# Patient Record
Sex: Female | Born: 1950
Health system: Southern US, Community
[De-identification: ages and names within clinical notes are randomized; demographics above are authoritative.]

## PROBLEM LIST (undated history)

## (undated) DIAGNOSIS — K219 Gastro-esophageal reflux disease without esophagitis: Secondary | ICD-10-CM

## (undated) DIAGNOSIS — T7840XA Allergy, unspecified, initial encounter: Secondary | ICD-10-CM

## (undated) DIAGNOSIS — M1990 Unspecified osteoarthritis, unspecified site: Secondary | ICD-10-CM

## (undated) DIAGNOSIS — E079 Disorder of thyroid, unspecified: Secondary | ICD-10-CM

## (undated) DIAGNOSIS — R011 Cardiac murmur, unspecified: Secondary | ICD-10-CM

## (undated) DIAGNOSIS — G43909 Migraine, unspecified, not intractable, without status migrainosus: Secondary | ICD-10-CM

## (undated) DIAGNOSIS — R569 Unspecified convulsions: Secondary | ICD-10-CM

## (undated) DIAGNOSIS — K5792 Diverticulitis of intestine, part unspecified, without perforation or abscess without bleeding: Secondary | ICD-10-CM

## (undated) DIAGNOSIS — R32 Unspecified urinary incontinence: Secondary | ICD-10-CM

## (undated) DIAGNOSIS — Z8619 Personal history of other infectious and parasitic diseases: Secondary | ICD-10-CM

## (undated) HISTORY — DX: Allergy, unspecified, initial encounter: T78.40XA

## (undated) HISTORY — DX: Cardiac murmur, unspecified: R01.1

## (undated) HISTORY — DX: Migraine, unspecified, not intractable, without status migrainosus: G43.909

## (undated) HISTORY — DX: Unspecified urinary incontinence: R32

## (undated) HISTORY — PX: REPLACEMENT TOTAL KNEE BILATERAL: SUR1225

## (undated) HISTORY — DX: Unspecified osteoarthritis, unspecified site: M19.90

## (undated) HISTORY — DX: Disorder of thyroid, unspecified: E07.9

## (undated) HISTORY — DX: Unspecified convulsions: R56.9

## (undated) HISTORY — DX: Gastro-esophageal reflux disease without esophagitis: K21.9

## (undated) HISTORY — DX: Diverticulitis of intestine, part unspecified, without perforation or abscess without bleeding: K57.92

## (undated) HISTORY — DX: Personal history of other infectious and parasitic diseases: Z86.19

---
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
## 1975-04-13 HISTORY — PX: BONE TUMOR EXCISION: SHX1254

## 1981-04-12 HISTORY — PX: ABDOMINAL HYSTERECTOMY: SHX81

## 2006-04-12 HISTORY — PX: CHOLECYSTECTOMY: SHX55

## 2016-04-14 DIAGNOSIS — E039 Hypothyroidism, unspecified: Secondary | ICD-10-CM | POA: Diagnosis not present

## 2016-04-14 DIAGNOSIS — M545 Low back pain: Secondary | ICD-10-CM | POA: Diagnosis not present

## 2016-04-14 DIAGNOSIS — E559 Vitamin D deficiency, unspecified: Secondary | ICD-10-CM | POA: Diagnosis not present

## 2016-04-14 DIAGNOSIS — I10 Essential (primary) hypertension: Secondary | ICD-10-CM | POA: Diagnosis not present

## 2016-04-22 DIAGNOSIS — R221 Localized swelling, mass and lump, neck: Secondary | ICD-10-CM | POA: Diagnosis not present

## 2016-04-29 DIAGNOSIS — M961 Postlaminectomy syndrome, not elsewhere classified: Secondary | ICD-10-CM | POA: Diagnosis not present

## 2016-04-29 DIAGNOSIS — M1712 Unilateral primary osteoarthritis, left knee: Secondary | ICD-10-CM | POA: Diagnosis not present

## 2016-04-29 DIAGNOSIS — M792 Neuralgia and neuritis, unspecified: Secondary | ICD-10-CM | POA: Diagnosis not present

## 2016-05-12 DIAGNOSIS — N950 Postmenopausal bleeding: Secondary | ICD-10-CM | POA: Diagnosis not present

## 2016-05-12 DIAGNOSIS — N952 Postmenopausal atrophic vaginitis: Secondary | ICD-10-CM | POA: Diagnosis not present

## 2016-05-26 DIAGNOSIS — M75102 Unspecified rotator cuff tear or rupture of left shoulder, not specified as traumatic: Secondary | ICD-10-CM | POA: Diagnosis not present

## 2016-05-27 DIAGNOSIS — M150 Primary generalized (osteo)arthritis: Secondary | ICD-10-CM | POA: Diagnosis not present

## 2016-05-27 DIAGNOSIS — M797 Fibromyalgia: Secondary | ICD-10-CM | POA: Diagnosis not present

## 2016-05-27 DIAGNOSIS — Z79899 Other long term (current) drug therapy: Secondary | ICD-10-CM | POA: Diagnosis not present

## 2016-05-27 DIAGNOSIS — E559 Vitamin D deficiency, unspecified: Secondary | ICD-10-CM | POA: Diagnosis not present

## 2016-05-27 DIAGNOSIS — R7982 Elevated C-reactive protein (CRP): Secondary | ICD-10-CM | POA: Diagnosis not present

## 2016-05-31 DIAGNOSIS — M75122 Complete rotator cuff tear or rupture of left shoulder, not specified as traumatic: Secondary | ICD-10-CM | POA: Diagnosis not present

## 2016-05-31 DIAGNOSIS — M25512 Pain in left shoulder: Secondary | ICD-10-CM | POA: Diagnosis not present

## 2016-06-09 DIAGNOSIS — R05 Cough: Secondary | ICD-10-CM | POA: Diagnosis not present

## 2016-07-05 DIAGNOSIS — M75102 Unspecified rotator cuff tear or rupture of left shoulder, not specified as traumatic: Secondary | ICD-10-CM | POA: Diagnosis not present

## 2016-07-13 DIAGNOSIS — I2510 Atherosclerotic heart disease of native coronary artery without angina pectoris: Secondary | ICD-10-CM | POA: Diagnosis not present

## 2016-07-13 DIAGNOSIS — E669 Obesity, unspecified: Secondary | ICD-10-CM | POA: Diagnosis not present

## 2016-07-13 DIAGNOSIS — I10 Essential (primary) hypertension: Secondary | ICD-10-CM | POA: Diagnosis not present

## 2016-07-13 DIAGNOSIS — Z01810 Encounter for preprocedural cardiovascular examination: Secondary | ICD-10-CM | POA: Diagnosis not present

## 2016-07-14 DIAGNOSIS — R1084 Generalized abdominal pain: Secondary | ICD-10-CM | POA: Diagnosis not present

## 2016-07-19 DIAGNOSIS — Z01818 Encounter for other preprocedural examination: Secondary | ICD-10-CM | POA: Diagnosis not present

## 2016-07-19 DIAGNOSIS — M75122 Complete rotator cuff tear or rupture of left shoulder, not specified as traumatic: Secondary | ICD-10-CM | POA: Diagnosis not present

## 2016-07-19 DIAGNOSIS — I10 Essential (primary) hypertension: Secondary | ICD-10-CM | POA: Diagnosis not present

## 2016-07-19 DIAGNOSIS — E039 Hypothyroidism, unspecified: Secondary | ICD-10-CM | POA: Diagnosis not present

## 2016-07-20 DIAGNOSIS — Z885 Allergy status to narcotic agent status: Secondary | ICD-10-CM | POA: Diagnosis not present

## 2016-07-20 DIAGNOSIS — R1032 Left lower quadrant pain: Secondary | ICD-10-CM | POA: Diagnosis not present

## 2016-07-20 DIAGNOSIS — R1030 Lower abdominal pain, unspecified: Secondary | ICD-10-CM | POA: Diagnosis not present

## 2016-07-20 DIAGNOSIS — Z881 Allergy status to other antibiotic agents status: Secondary | ICD-10-CM | POA: Diagnosis not present

## 2016-07-20 DIAGNOSIS — Z90710 Acquired absence of both cervix and uterus: Secondary | ICD-10-CM | POA: Diagnosis not present

## 2016-07-20 DIAGNOSIS — R300 Dysuria: Secondary | ICD-10-CM | POA: Diagnosis not present

## 2016-07-20 DIAGNOSIS — K219 Gastro-esophageal reflux disease without esophagitis: Secondary | ICD-10-CM | POA: Diagnosis not present

## 2016-07-20 DIAGNOSIS — E039 Hypothyroidism, unspecified: Secondary | ICD-10-CM | POA: Diagnosis not present

## 2016-07-20 DIAGNOSIS — Z91013 Allergy to seafood: Secondary | ICD-10-CM | POA: Diagnosis not present

## 2016-07-20 DIAGNOSIS — Z96651 Presence of right artificial knee joint: Secondary | ICD-10-CM | POA: Diagnosis not present

## 2016-07-20 DIAGNOSIS — Z79891 Long term (current) use of opiate analgesic: Secondary | ICD-10-CM | POA: Diagnosis not present

## 2016-07-20 DIAGNOSIS — Z89011 Acquired absence of right thumb: Secondary | ICD-10-CM | POA: Diagnosis not present

## 2016-07-20 DIAGNOSIS — Z886 Allergy status to analgesic agent status: Secondary | ICD-10-CM | POA: Diagnosis not present

## 2016-07-20 DIAGNOSIS — G8929 Other chronic pain: Secondary | ICD-10-CM | POA: Diagnosis not present

## 2016-07-20 DIAGNOSIS — I10 Essential (primary) hypertension: Secondary | ICD-10-CM | POA: Diagnosis not present

## 2016-07-20 DIAGNOSIS — Z91048 Other nonmedicinal substance allergy status: Secondary | ICD-10-CM | POA: Diagnosis not present

## 2016-07-26 DIAGNOSIS — R102 Pelvic and perineal pain: Secondary | ICD-10-CM | POA: Diagnosis not present

## 2016-07-30 DIAGNOSIS — M75102 Unspecified rotator cuff tear or rupture of left shoulder, not specified as traumatic: Secondary | ICD-10-CM | POA: Diagnosis not present

## 2016-07-30 DIAGNOSIS — S43432A Superior glenoid labrum lesion of left shoulder, initial encounter: Secondary | ICD-10-CM | POA: Diagnosis not present

## 2016-07-30 DIAGNOSIS — M7522 Bicipital tendinitis, left shoulder: Secondary | ICD-10-CM | POA: Diagnosis not present

## 2016-07-30 DIAGNOSIS — M75122 Complete rotator cuff tear or rupture of left shoulder, not specified as traumatic: Secondary | ICD-10-CM | POA: Diagnosis not present

## 2016-07-30 DIAGNOSIS — M24112 Other articular cartilage disorders, left shoulder: Secondary | ICD-10-CM | POA: Diagnosis not present

## 2016-08-23 DIAGNOSIS — I70213 Atherosclerosis of native arteries of extremities with intermittent claudication, bilateral legs: Secondary | ICD-10-CM | POA: Diagnosis not present

## 2016-08-23 DIAGNOSIS — I6523 Occlusion and stenosis of bilateral carotid arteries: Secondary | ICD-10-CM | POA: Diagnosis not present

## 2016-09-01 DIAGNOSIS — I6523 Occlusion and stenosis of bilateral carotid arteries: Secondary | ICD-10-CM | POA: Diagnosis not present

## 2016-09-01 DIAGNOSIS — I872 Venous insufficiency (chronic) (peripheral): Secondary | ICD-10-CM | POA: Diagnosis not present

## 2016-09-21 DIAGNOSIS — Z79899 Other long term (current) drug therapy: Secondary | ICD-10-CM | POA: Diagnosis not present

## 2016-09-21 DIAGNOSIS — M797 Fibromyalgia: Secondary | ICD-10-CM | POA: Diagnosis not present

## 2016-09-21 DIAGNOSIS — E039 Hypothyroidism, unspecified: Secondary | ICD-10-CM | POA: Diagnosis not present

## 2016-09-21 DIAGNOSIS — K219 Gastro-esophageal reflux disease without esophagitis: Secondary | ICD-10-CM | POA: Diagnosis not present

## 2016-09-24 DIAGNOSIS — M961 Postlaminectomy syndrome, not elsewhere classified: Secondary | ICD-10-CM | POA: Diagnosis not present

## 2016-09-24 DIAGNOSIS — Z79891 Long term (current) use of opiate analgesic: Secondary | ICD-10-CM | POA: Diagnosis not present

## 2016-09-24 DIAGNOSIS — Z96652 Presence of left artificial knee joint: Secondary | ICD-10-CM | POA: Diagnosis not present

## 2016-09-24 DIAGNOSIS — M75102 Unspecified rotator cuff tear or rupture of left shoulder, not specified as traumatic: Secondary | ICD-10-CM | POA: Diagnosis not present

## 2016-09-24 DIAGNOSIS — M4802 Spinal stenosis, cervical region: Secondary | ICD-10-CM | POA: Diagnosis not present

## 2016-09-30 DIAGNOSIS — Z0000 Encounter for general adult medical examination without abnormal findings: Secondary | ICD-10-CM | POA: Diagnosis not present

## 2016-09-30 DIAGNOSIS — E039 Hypothyroidism, unspecified: Secondary | ICD-10-CM | POA: Diagnosis not present

## 2016-09-30 DIAGNOSIS — G4700 Insomnia, unspecified: Secondary | ICD-10-CM | POA: Diagnosis not present

## 2016-10-02 DIAGNOSIS — I10 Essential (primary) hypertension: Secondary | ICD-10-CM | POA: Diagnosis not present

## 2016-10-02 DIAGNOSIS — G8929 Other chronic pain: Secondary | ICD-10-CM | POA: Diagnosis not present

## 2016-10-02 DIAGNOSIS — Z79899 Other long term (current) drug therapy: Secondary | ICD-10-CM | POA: Diagnosis not present

## 2016-10-02 DIAGNOSIS — M62838 Other muscle spasm: Secondary | ICD-10-CM | POA: Diagnosis not present

## 2016-10-02 DIAGNOSIS — Z79891 Long term (current) use of opiate analgesic: Secondary | ICD-10-CM | POA: Diagnosis not present

## 2016-10-02 DIAGNOSIS — Z9889 Other specified postprocedural states: Secondary | ICD-10-CM | POA: Diagnosis not present

## 2016-10-02 DIAGNOSIS — M25512 Pain in left shoulder: Secondary | ICD-10-CM | POA: Diagnosis not present

## 2016-10-02 DIAGNOSIS — M47812 Spondylosis without myelopathy or radiculopathy, cervical region: Secondary | ICD-10-CM | POA: Diagnosis not present

## 2016-10-02 DIAGNOSIS — M797 Fibromyalgia: Secondary | ICD-10-CM | POA: Diagnosis not present

## 2016-10-02 DIAGNOSIS — Z981 Arthrodesis status: Secondary | ICD-10-CM | POA: Diagnosis not present

## 2016-10-02 DIAGNOSIS — M542 Cervicalgia: Secondary | ICD-10-CM | POA: Diagnosis not present

## 2016-10-04 DIAGNOSIS — M542 Cervicalgia: Secondary | ICD-10-CM | POA: Diagnosis not present

## 2016-10-04 DIAGNOSIS — M4802 Spinal stenosis, cervical region: Secondary | ICD-10-CM | POA: Diagnosis not present

## 2016-10-04 DIAGNOSIS — I10 Essential (primary) hypertension: Secondary | ICD-10-CM | POA: Diagnosis not present

## 2016-10-04 DIAGNOSIS — M5412 Radiculopathy, cervical region: Secondary | ICD-10-CM | POA: Diagnosis not present

## 2016-10-21 DIAGNOSIS — M19012 Primary osteoarthritis, left shoulder: Secondary | ICD-10-CM | POA: Diagnosis not present

## 2016-11-02 DIAGNOSIS — M961 Postlaminectomy syndrome, not elsewhere classified: Secondary | ICD-10-CM | POA: Diagnosis not present

## 2016-11-02 DIAGNOSIS — M75122 Complete rotator cuff tear or rupture of left shoulder, not specified as traumatic: Secondary | ICD-10-CM | POA: Diagnosis not present

## 2016-11-02 DIAGNOSIS — Z96652 Presence of left artificial knee joint: Secondary | ICD-10-CM | POA: Diagnosis not present

## 2016-11-02 DIAGNOSIS — Z79891 Long term (current) use of opiate analgesic: Secondary | ICD-10-CM | POA: Diagnosis not present

## 2016-11-02 DIAGNOSIS — M4802 Spinal stenosis, cervical region: Secondary | ICD-10-CM | POA: Diagnosis not present

## 2016-11-09 DIAGNOSIS — H9201 Otalgia, right ear: Secondary | ICD-10-CM | POA: Diagnosis not present

## 2016-11-18 DIAGNOSIS — Z91041 Radiographic dye allergy status: Secondary | ICD-10-CM | POA: Diagnosis not present

## 2016-11-18 DIAGNOSIS — Z79891 Long term (current) use of opiate analgesic: Secondary | ICD-10-CM | POA: Diagnosis not present

## 2016-11-18 DIAGNOSIS — Z9049 Acquired absence of other specified parts of digestive tract: Secondary | ICD-10-CM | POA: Diagnosis not present

## 2016-11-18 DIAGNOSIS — Z96653 Presence of artificial knee joint, bilateral: Secondary | ICD-10-CM | POA: Diagnosis not present

## 2016-11-18 DIAGNOSIS — Z8049 Family history of malignant neoplasm of other genital organs: Secondary | ICD-10-CM | POA: Diagnosis not present

## 2016-11-18 DIAGNOSIS — Z888 Allergy status to other drugs, medicaments and biological substances status: Secondary | ICD-10-CM | POA: Diagnosis not present

## 2016-11-18 DIAGNOSIS — M4802 Spinal stenosis, cervical region: Secondary | ICD-10-CM | POA: Diagnosis not present

## 2016-11-18 DIAGNOSIS — Z880 Allergy status to penicillin: Secondary | ICD-10-CM | POA: Diagnosis not present

## 2016-11-18 DIAGNOSIS — Z885 Allergy status to narcotic agent status: Secondary | ICD-10-CM | POA: Diagnosis not present

## 2016-11-18 DIAGNOSIS — K219 Gastro-esophageal reflux disease without esophagitis: Secondary | ICD-10-CM | POA: Diagnosis not present

## 2016-11-18 DIAGNOSIS — Z91013 Allergy to seafood: Secondary | ICD-10-CM | POA: Diagnosis not present

## 2016-11-18 DIAGNOSIS — E039 Hypothyroidism, unspecified: Secondary | ICD-10-CM | POA: Diagnosis not present

## 2016-11-18 DIAGNOSIS — I10 Essential (primary) hypertension: Secondary | ICD-10-CM | POA: Diagnosis not present

## 2016-11-18 DIAGNOSIS — Z881 Allergy status to other antibiotic agents status: Secondary | ICD-10-CM | POA: Diagnosis not present

## 2016-11-18 DIAGNOSIS — M5412 Radiculopathy, cervical region: Secondary | ICD-10-CM | POA: Diagnosis not present

## 2016-11-18 DIAGNOSIS — Z886 Allergy status to analgesic agent status: Secondary | ICD-10-CM | POA: Diagnosis not present

## 2016-11-21 DIAGNOSIS — L233 Allergic contact dermatitis due to drugs in contact with skin: Secondary | ICD-10-CM | POA: Diagnosis not present

## 2016-11-21 DIAGNOSIS — I10 Essential (primary) hypertension: Secondary | ICD-10-CM | POA: Diagnosis not present

## 2016-11-21 DIAGNOSIS — T498X5A Adverse effect of other topical agents, initial encounter: Secondary | ICD-10-CM | POA: Diagnosis not present

## 2016-12-17 DIAGNOSIS — R6884 Jaw pain: Secondary | ICD-10-CM | POA: Diagnosis not present

## 2016-12-17 DIAGNOSIS — R220 Localized swelling, mass and lump, head: Secondary | ICD-10-CM | POA: Diagnosis not present

## 2016-12-20 DIAGNOSIS — R221 Localized swelling, mass and lump, neck: Secondary | ICD-10-CM | POA: Diagnosis not present

## 2016-12-20 DIAGNOSIS — R6884 Jaw pain: Secondary | ICD-10-CM | POA: Diagnosis not present

## 2016-12-27 DIAGNOSIS — S161XXA Strain of muscle, fascia and tendon at neck level, initial encounter: Secondary | ICD-10-CM | POA: Diagnosis not present

## 2016-12-27 DIAGNOSIS — M4802 Spinal stenosis, cervical region: Secondary | ICD-10-CM | POA: Diagnosis not present

## 2016-12-29 DIAGNOSIS — N898 Other specified noninflammatory disorders of vagina: Secondary | ICD-10-CM | POA: Diagnosis not present

## 2016-12-31 DIAGNOSIS — M75102 Unspecified rotator cuff tear or rupture of left shoulder, not specified as traumatic: Secondary | ICD-10-CM | POA: Diagnosis not present

## 2016-12-31 DIAGNOSIS — M961 Postlaminectomy syndrome, not elsewhere classified: Secondary | ICD-10-CM | POA: Diagnosis not present

## 2016-12-31 DIAGNOSIS — Z79891 Long term (current) use of opiate analgesic: Secondary | ICD-10-CM | POA: Diagnosis not present

## 2016-12-31 DIAGNOSIS — Z96652 Presence of left artificial knee joint: Secondary | ICD-10-CM | POA: Diagnosis not present

## 2016-12-31 DIAGNOSIS — M4802 Spinal stenosis, cervical region: Secondary | ICD-10-CM | POA: Diagnosis not present

## 2017-01-07 DIAGNOSIS — M150 Primary generalized (osteo)arthritis: Secondary | ICD-10-CM | POA: Diagnosis not present

## 2017-01-07 DIAGNOSIS — R7982 Elevated C-reactive protein (CRP): Secondary | ICD-10-CM | POA: Diagnosis not present

## 2017-01-07 DIAGNOSIS — M797 Fibromyalgia: Secondary | ICD-10-CM | POA: Diagnosis not present

## 2017-01-07 DIAGNOSIS — Z79899 Other long term (current) drug therapy: Secondary | ICD-10-CM | POA: Diagnosis not present

## 2017-01-12 DIAGNOSIS — M5412 Radiculopathy, cervical region: Secondary | ICD-10-CM | POA: Diagnosis not present

## 2017-01-12 DIAGNOSIS — E039 Hypothyroidism, unspecified: Secondary | ICD-10-CM | POA: Diagnosis not present

## 2017-01-12 DIAGNOSIS — Z881 Allergy status to other antibiotic agents status: Secondary | ICD-10-CM | POA: Diagnosis not present

## 2017-01-12 DIAGNOSIS — M48062 Spinal stenosis, lumbar region with neurogenic claudication: Secondary | ICD-10-CM | POA: Diagnosis not present

## 2017-01-12 DIAGNOSIS — M961 Postlaminectomy syndrome, not elsewhere classified: Secondary | ICD-10-CM | POA: Diagnosis not present

## 2017-01-12 DIAGNOSIS — Z880 Allergy status to penicillin: Secondary | ICD-10-CM | POA: Diagnosis not present

## 2017-01-12 DIAGNOSIS — Z91013 Allergy to seafood: Secondary | ICD-10-CM | POA: Diagnosis not present

## 2017-01-12 DIAGNOSIS — M4802 Spinal stenosis, cervical region: Secondary | ICD-10-CM | POA: Diagnosis not present

## 2017-01-12 DIAGNOSIS — Z8049 Family history of malignant neoplasm of other genital organs: Secondary | ICD-10-CM | POA: Diagnosis not present

## 2017-01-12 DIAGNOSIS — Z886 Allergy status to analgesic agent status: Secondary | ICD-10-CM | POA: Diagnosis not present

## 2017-01-12 DIAGNOSIS — I10 Essential (primary) hypertension: Secondary | ICD-10-CM | POA: Diagnosis not present

## 2017-01-12 DIAGNOSIS — K219 Gastro-esophageal reflux disease without esophagitis: Secondary | ICD-10-CM | POA: Diagnosis not present

## 2017-01-12 DIAGNOSIS — Z885 Allergy status to narcotic agent status: Secondary | ICD-10-CM | POA: Diagnosis not present

## 2017-01-12 DIAGNOSIS — M5136 Other intervertebral disc degeneration, lumbar region: Secondary | ICD-10-CM | POA: Diagnosis not present

## 2017-01-29 DIAGNOSIS — R2242 Localized swelling, mass and lump, left lower limb: Secondary | ICD-10-CM | POA: Diagnosis not present

## 2017-01-29 DIAGNOSIS — L03116 Cellulitis of left lower limb: Secondary | ICD-10-CM | POA: Diagnosis not present

## 2017-01-29 DIAGNOSIS — R600 Localized edema: Secondary | ICD-10-CM | POA: Diagnosis not present

## 2017-01-29 DIAGNOSIS — I872 Venous insufficiency (chronic) (peripheral): Secondary | ICD-10-CM | POA: Diagnosis not present

## 2017-01-30 DIAGNOSIS — S20412A Abrasion of left back wall of thorax, initial encounter: Secondary | ICD-10-CM | POA: Diagnosis not present

## 2017-01-30 DIAGNOSIS — Z981 Arthrodesis status: Secondary | ICD-10-CM | POA: Diagnosis not present

## 2017-01-30 DIAGNOSIS — M25512 Pain in left shoulder: Secondary | ICD-10-CM | POA: Diagnosis not present

## 2017-01-30 DIAGNOSIS — M5489 Other dorsalgia: Secondary | ICD-10-CM | POA: Diagnosis not present

## 2017-01-30 DIAGNOSIS — T148XXA Other injury of unspecified body region, initial encounter: Secondary | ICD-10-CM | POA: Diagnosis not present

## 2017-01-30 DIAGNOSIS — M79622 Pain in left upper arm: Secondary | ICD-10-CM | POA: Diagnosis not present

## 2017-01-30 DIAGNOSIS — R0781 Pleurodynia: Secondary | ICD-10-CM | POA: Diagnosis not present

## 2017-01-30 DIAGNOSIS — S20212A Contusion of left front wall of thorax, initial encounter: Secondary | ICD-10-CM | POA: Diagnosis not present

## 2017-01-30 DIAGNOSIS — I10 Essential (primary) hypertension: Secondary | ICD-10-CM | POA: Diagnosis not present

## 2017-02-01 DIAGNOSIS — L03116 Cellulitis of left lower limb: Secondary | ICD-10-CM | POA: Diagnosis not present

## 2017-02-01 DIAGNOSIS — E559 Vitamin D deficiency, unspecified: Secondary | ICD-10-CM | POA: Diagnosis not present

## 2017-02-01 DIAGNOSIS — R799 Abnormal finding of blood chemistry, unspecified: Secondary | ICD-10-CM | POA: Diagnosis not present

## 2017-02-06 DIAGNOSIS — G2581 Restless legs syndrome: Secondary | ICD-10-CM | POA: Diagnosis not present

## 2017-02-06 DIAGNOSIS — I10 Essential (primary) hypertension: Secondary | ICD-10-CM | POA: Diagnosis not present

## 2017-02-06 DIAGNOSIS — Z981 Arthrodesis status: Secondary | ICD-10-CM | POA: Diagnosis not present

## 2017-02-06 DIAGNOSIS — Z6839 Body mass index (BMI) 39.0-39.9, adult: Secondary | ICD-10-CM | POA: Diagnosis not present

## 2017-02-06 DIAGNOSIS — M25512 Pain in left shoulder: Secondary | ICD-10-CM | POA: Diagnosis present

## 2017-02-06 DIAGNOSIS — M79661 Pain in right lower leg: Secondary | ICD-10-CM | POA: Diagnosis not present

## 2017-02-06 DIAGNOSIS — E039 Hypothyroidism, unspecified: Secondary | ICD-10-CM | POA: Diagnosis present

## 2017-02-06 DIAGNOSIS — G43909 Migraine, unspecified, not intractable, without status migrainosus: Secondary | ICD-10-CM | POA: Diagnosis present

## 2017-02-06 DIAGNOSIS — G4730 Sleep apnea, unspecified: Secondary | ICD-10-CM | POA: Diagnosis present

## 2017-02-06 DIAGNOSIS — Z0189 Encounter for other specified special examinations: Secondary | ICD-10-CM | POA: Diagnosis not present

## 2017-02-06 DIAGNOSIS — M797 Fibromyalgia: Secondary | ICD-10-CM | POA: Diagnosis not present

## 2017-02-06 DIAGNOSIS — I872 Venous insufficiency (chronic) (peripheral): Secondary | ICD-10-CM | POA: Diagnosis present

## 2017-02-06 DIAGNOSIS — Z96653 Presence of artificial knee joint, bilateral: Secondary | ICD-10-CM | POA: Diagnosis present

## 2017-02-06 DIAGNOSIS — Z90710 Acquired absence of both cervix and uterus: Secondary | ICD-10-CM | POA: Diagnosis not present

## 2017-02-06 DIAGNOSIS — L03116 Cellulitis of left lower limb: Secondary | ICD-10-CM | POA: Diagnosis not present

## 2017-02-06 DIAGNOSIS — M549 Dorsalgia, unspecified: Secondary | ICD-10-CM | POA: Diagnosis not present

## 2017-02-14 DIAGNOSIS — I6523 Occlusion and stenosis of bilateral carotid arteries: Secondary | ICD-10-CM | POA: Diagnosis not present

## 2017-02-14 DIAGNOSIS — I872 Venous insufficiency (chronic) (peripheral): Secondary | ICD-10-CM | POA: Diagnosis not present

## 2017-03-11 DIAGNOSIS — R05 Cough: Secondary | ICD-10-CM | POA: Diagnosis not present

## 2017-03-11 DIAGNOSIS — M961 Postlaminectomy syndrome, not elsewhere classified: Secondary | ICD-10-CM | POA: Diagnosis not present

## 2017-03-11 DIAGNOSIS — I872 Venous insufficiency (chronic) (peripheral): Secondary | ICD-10-CM | POA: Diagnosis not present

## 2017-03-11 DIAGNOSIS — R233 Spontaneous ecchymoses: Secondary | ICD-10-CM | POA: Diagnosis not present

## 2017-03-11 DIAGNOSIS — Z79891 Long term (current) use of opiate analgesic: Secondary | ICD-10-CM | POA: Diagnosis not present

## 2017-03-11 DIAGNOSIS — I119 Hypertensive heart disease without heart failure: Secondary | ICD-10-CM | POA: Diagnosis not present

## 2017-03-11 DIAGNOSIS — Z96652 Presence of left artificial knee joint: Secondary | ICD-10-CM | POA: Diagnosis not present

## 2017-03-11 DIAGNOSIS — M75102 Unspecified rotator cuff tear or rupture of left shoulder, not specified as traumatic: Secondary | ICD-10-CM | POA: Diagnosis not present

## 2017-03-11 DIAGNOSIS — E611 Iron deficiency: Secondary | ICD-10-CM | POA: Diagnosis not present

## 2017-03-11 DIAGNOSIS — M4802 Spinal stenosis, cervical region: Secondary | ICD-10-CM | POA: Diagnosis not present

## 2017-03-14 DIAGNOSIS — I890 Lymphedema, not elsewhere classified: Secondary | ICD-10-CM | POA: Diagnosis not present

## 2017-03-14 DIAGNOSIS — I872 Venous insufficiency (chronic) (peripheral): Secondary | ICD-10-CM | POA: Diagnosis not present

## 2017-03-18 DIAGNOSIS — M24574 Contracture, right foot: Secondary | ICD-10-CM | POA: Diagnosis not present

## 2017-03-18 DIAGNOSIS — M2041 Other hammer toe(s) (acquired), right foot: Secondary | ICD-10-CM | POA: Diagnosis not present

## 2017-03-18 DIAGNOSIS — M24575 Contracture, left foot: Secondary | ICD-10-CM | POA: Diagnosis not present

## 2017-03-18 DIAGNOSIS — M79675 Pain in left toe(s): Secondary | ICD-10-CM | POA: Diagnosis not present

## 2017-03-18 DIAGNOSIS — M2042 Other hammer toe(s) (acquired), left foot: Secondary | ICD-10-CM | POA: Diagnosis not present

## 2017-03-18 DIAGNOSIS — M79674 Pain in right toe(s): Secondary | ICD-10-CM | POA: Diagnosis not present

## 2017-04-20 DIAGNOSIS — M961 Postlaminectomy syndrome, not elsewhere classified: Secondary | ICD-10-CM | POA: Diagnosis not present

## 2017-04-20 DIAGNOSIS — M4802 Spinal stenosis, cervical region: Secondary | ICD-10-CM | POA: Diagnosis not present

## 2017-04-20 DIAGNOSIS — Z96652 Presence of left artificial knee joint: Secondary | ICD-10-CM | POA: Diagnosis not present

## 2017-04-20 DIAGNOSIS — M75102 Unspecified rotator cuff tear or rupture of left shoulder, not specified as traumatic: Secondary | ICD-10-CM | POA: Diagnosis not present

## 2017-04-20 DIAGNOSIS — Z79891 Long term (current) use of opiate analgesic: Secondary | ICD-10-CM | POA: Diagnosis not present

## 2017-04-21 DIAGNOSIS — I872 Venous insufficiency (chronic) (peripheral): Secondary | ICD-10-CM | POA: Diagnosis not present

## 2017-04-21 DIAGNOSIS — E669 Obesity, unspecified: Secondary | ICD-10-CM | POA: Diagnosis not present

## 2017-04-21 DIAGNOSIS — I890 Lymphedema, not elsewhere classified: Secondary | ICD-10-CM | POA: Diagnosis not present

## 2017-05-18 DIAGNOSIS — M533 Sacrococcygeal disorders, not elsewhere classified: Secondary | ICD-10-CM | POA: Diagnosis not present

## 2017-05-18 DIAGNOSIS — M4802 Spinal stenosis, cervical region: Secondary | ICD-10-CM | POA: Diagnosis not present

## 2017-05-18 DIAGNOSIS — M961 Postlaminectomy syndrome, not elsewhere classified: Secondary | ICD-10-CM | POA: Diagnosis not present

## 2017-05-18 DIAGNOSIS — Z96652 Presence of left artificial knee joint: Secondary | ICD-10-CM | POA: Diagnosis not present

## 2017-05-18 DIAGNOSIS — M75102 Unspecified rotator cuff tear or rupture of left shoulder, not specified as traumatic: Secondary | ICD-10-CM | POA: Diagnosis not present

## 2017-05-25 DIAGNOSIS — M533 Sacrococcygeal disorders, not elsewhere classified: Secondary | ICD-10-CM | POA: Diagnosis not present

## 2017-05-25 DIAGNOSIS — M461 Sacroiliitis, not elsewhere classified: Secondary | ICD-10-CM | POA: Diagnosis not present

## 2017-06-06 DIAGNOSIS — R7982 Elevated C-reactive protein (CRP): Secondary | ICD-10-CM | POA: Diagnosis not present

## 2017-06-06 DIAGNOSIS — M797 Fibromyalgia: Secondary | ICD-10-CM | POA: Diagnosis not present

## 2017-06-06 DIAGNOSIS — E039 Hypothyroidism, unspecified: Secondary | ICD-10-CM | POA: Diagnosis not present

## 2017-06-06 DIAGNOSIS — E559 Vitamin D deficiency, unspecified: Secondary | ICD-10-CM | POA: Diagnosis not present

## 2017-06-06 DIAGNOSIS — M150 Primary generalized (osteo)arthritis: Secondary | ICD-10-CM | POA: Diagnosis not present

## 2017-06-06 DIAGNOSIS — Z79899 Other long term (current) drug therapy: Secondary | ICD-10-CM | POA: Diagnosis not present

## 2017-06-22 DIAGNOSIS — M75102 Unspecified rotator cuff tear or rupture of left shoulder, not specified as traumatic: Secondary | ICD-10-CM | POA: Diagnosis not present

## 2017-06-22 DIAGNOSIS — M4802 Spinal stenosis, cervical region: Secondary | ICD-10-CM | POA: Diagnosis not present

## 2017-06-22 DIAGNOSIS — M533 Sacrococcygeal disorders, not elsewhere classified: Secondary | ICD-10-CM | POA: Diagnosis not present

## 2017-06-22 DIAGNOSIS — Z96652 Presence of left artificial knee joint: Secondary | ICD-10-CM | POA: Diagnosis not present

## 2017-06-22 DIAGNOSIS — M961 Postlaminectomy syndrome, not elsewhere classified: Secondary | ICD-10-CM | POA: Diagnosis not present

## 2017-07-19 DIAGNOSIS — I890 Lymphedema, not elsewhere classified: Secondary | ICD-10-CM | POA: Diagnosis not present

## 2017-07-20 DIAGNOSIS — M961 Postlaminectomy syndrome, not elsewhere classified: Secondary | ICD-10-CM | POA: Diagnosis not present

## 2017-07-20 DIAGNOSIS — M533 Sacrococcygeal disorders, not elsewhere classified: Secondary | ICD-10-CM | POA: Diagnosis not present

## 2017-07-20 DIAGNOSIS — M4802 Spinal stenosis, cervical region: Secondary | ICD-10-CM | POA: Diagnosis not present

## 2017-07-20 DIAGNOSIS — M75102 Unspecified rotator cuff tear or rupture of left shoulder, not specified as traumatic: Secondary | ICD-10-CM | POA: Diagnosis not present

## 2017-07-20 DIAGNOSIS — Z96652 Presence of left artificial knee joint: Secondary | ICD-10-CM | POA: Diagnosis not present

## 2017-07-21 DIAGNOSIS — F339 Major depressive disorder, recurrent, unspecified: Secondary | ICD-10-CM | POA: Diagnosis not present

## 2017-07-21 DIAGNOSIS — Z79899 Other long term (current) drug therapy: Secondary | ICD-10-CM | POA: Diagnosis not present

## 2017-07-21 DIAGNOSIS — I200 Unstable angina: Secondary | ICD-10-CM | POA: Diagnosis not present

## 2017-07-21 DIAGNOSIS — Z1379 Encounter for other screening for genetic and chromosomal anomalies: Secondary | ICD-10-CM | POA: Diagnosis not present

## 2017-07-21 DIAGNOSIS — I259 Chronic ischemic heart disease, unspecified: Secondary | ICD-10-CM | POA: Diagnosis not present

## 2017-08-18 DIAGNOSIS — M7918 Myalgia, other site: Secondary | ICD-10-CM | POA: Diagnosis not present

## 2017-08-18 DIAGNOSIS — M4802 Spinal stenosis, cervical region: Secondary | ICD-10-CM | POA: Diagnosis not present

## 2017-08-18 DIAGNOSIS — M75102 Unspecified rotator cuff tear or rupture of left shoulder, not specified as traumatic: Secondary | ICD-10-CM | POA: Diagnosis not present

## 2017-08-18 DIAGNOSIS — M8588 Other specified disorders of bone density and structure, other site: Secondary | ICD-10-CM | POA: Diagnosis not present

## 2017-08-18 DIAGNOSIS — E559 Vitamin D deficiency, unspecified: Secondary | ICD-10-CM | POA: Diagnosis not present

## 2017-08-18 DIAGNOSIS — E038 Other specified hypothyroidism: Secondary | ICD-10-CM | POA: Diagnosis not present

## 2017-08-18 DIAGNOSIS — M533 Sacrococcygeal disorders, not elsewhere classified: Secondary | ICD-10-CM | POA: Diagnosis not present

## 2017-08-18 DIAGNOSIS — Z96652 Presence of left artificial knee joint: Secondary | ICD-10-CM | POA: Diagnosis not present

## 2017-08-18 DIAGNOSIS — I10 Essential (primary) hypertension: Secondary | ICD-10-CM | POA: Diagnosis not present

## 2017-08-18 DIAGNOSIS — Z79891 Long term (current) use of opiate analgesic: Secondary | ICD-10-CM | POA: Diagnosis not present

## 2017-08-18 DIAGNOSIS — M961 Postlaminectomy syndrome, not elsewhere classified: Secondary | ICD-10-CM | POA: Diagnosis not present

## 2017-08-23 DIAGNOSIS — G4700 Insomnia, unspecified: Secondary | ICD-10-CM | POA: Diagnosis not present

## 2017-08-23 DIAGNOSIS — G43709 Chronic migraine without aura, not intractable, without status migrainosus: Secondary | ICD-10-CM | POA: Diagnosis not present

## 2017-08-23 DIAGNOSIS — J209 Acute bronchitis, unspecified: Secondary | ICD-10-CM | POA: Diagnosis not present

## 2017-08-23 DIAGNOSIS — I119 Hypertensive heart disease without heart failure: Secondary | ICD-10-CM | POA: Diagnosis not present

## 2017-08-25 DIAGNOSIS — I890 Lymphedema, not elsewhere classified: Secondary | ICD-10-CM | POA: Diagnosis not present

## 2017-08-31 DIAGNOSIS — R600 Localized edema: Secondary | ICD-10-CM | POA: Diagnosis not present

## 2017-08-31 DIAGNOSIS — E663 Overweight: Secondary | ICD-10-CM | POA: Diagnosis not present

## 2017-08-31 DIAGNOSIS — I6523 Occlusion and stenosis of bilateral carotid arteries: Secondary | ICD-10-CM | POA: Diagnosis not present

## 2017-08-31 DIAGNOSIS — I872 Venous insufficiency (chronic) (peripheral): Secondary | ICD-10-CM | POA: Diagnosis not present

## 2017-08-31 DIAGNOSIS — M79605 Pain in left leg: Secondary | ICD-10-CM | POA: Diagnosis not present

## 2017-09-15 DIAGNOSIS — M961 Postlaminectomy syndrome, not elsewhere classified: Secondary | ICD-10-CM | POA: Diagnosis not present

## 2017-09-15 DIAGNOSIS — M75102 Unspecified rotator cuff tear or rupture of left shoulder, not specified as traumatic: Secondary | ICD-10-CM | POA: Diagnosis not present

## 2017-09-15 DIAGNOSIS — M533 Sacrococcygeal disorders, not elsewhere classified: Secondary | ICD-10-CM | POA: Diagnosis not present

## 2017-09-15 DIAGNOSIS — Z96652 Presence of left artificial knee joint: Secondary | ICD-10-CM | POA: Diagnosis not present

## 2017-09-15 DIAGNOSIS — M4802 Spinal stenosis, cervical region: Secondary | ICD-10-CM | POA: Diagnosis not present

## 2017-10-17 DIAGNOSIS — M533 Sacrococcygeal disorders, not elsewhere classified: Secondary | ICD-10-CM | POA: Diagnosis not present

## 2017-10-17 DIAGNOSIS — Z96652 Presence of left artificial knee joint: Secondary | ICD-10-CM | POA: Diagnosis not present

## 2017-10-17 DIAGNOSIS — M75102 Unspecified rotator cuff tear or rupture of left shoulder, not specified as traumatic: Secondary | ICD-10-CM | POA: Diagnosis not present

## 2017-10-17 DIAGNOSIS — M4802 Spinal stenosis, cervical region: Secondary | ICD-10-CM | POA: Diagnosis not present

## 2017-10-17 DIAGNOSIS — M961 Postlaminectomy syndrome, not elsewhere classified: Secondary | ICD-10-CM | POA: Diagnosis not present

## 2017-10-24 DIAGNOSIS — I6523 Occlusion and stenosis of bilateral carotid arteries: Secondary | ICD-10-CM | POA: Diagnosis not present

## 2017-11-10 DIAGNOSIS — G43709 Chronic migraine without aura, not intractable, without status migrainosus: Secondary | ICD-10-CM | POA: Diagnosis not present

## 2017-11-10 DIAGNOSIS — R55 Syncope and collapse: Secondary | ICD-10-CM | POA: Diagnosis not present

## 2017-11-10 DIAGNOSIS — I119 Hypertensive heart disease without heart failure: Secondary | ICD-10-CM | POA: Diagnosis not present

## 2017-11-14 DIAGNOSIS — M4802 Spinal stenosis, cervical region: Secondary | ICD-10-CM | POA: Diagnosis not present

## 2017-11-14 DIAGNOSIS — Z79891 Long term (current) use of opiate analgesic: Secondary | ICD-10-CM | POA: Diagnosis not present

## 2017-11-14 DIAGNOSIS — M75102 Unspecified rotator cuff tear or rupture of left shoulder, not specified as traumatic: Secondary | ICD-10-CM | POA: Diagnosis not present

## 2017-11-14 DIAGNOSIS — Z96652 Presence of left artificial knee joint: Secondary | ICD-10-CM | POA: Diagnosis not present

## 2017-11-14 DIAGNOSIS — M7918 Myalgia, other site: Secondary | ICD-10-CM | POA: Diagnosis not present

## 2017-11-14 DIAGNOSIS — M961 Postlaminectomy syndrome, not elsewhere classified: Secondary | ICD-10-CM | POA: Diagnosis not present

## 2017-11-14 DIAGNOSIS — M533 Sacrococcygeal disorders, not elsewhere classified: Secondary | ICD-10-CM | POA: Diagnosis not present

## 2017-11-25 DIAGNOSIS — M797 Fibromyalgia: Secondary | ICD-10-CM | POA: Diagnosis not present

## 2017-11-25 DIAGNOSIS — Z79899 Other long term (current) drug therapy: Secondary | ICD-10-CM | POA: Diagnosis not present

## 2017-11-25 DIAGNOSIS — M150 Primary generalized (osteo)arthritis: Secondary | ICD-10-CM | POA: Diagnosis not present

## 2017-11-25 DIAGNOSIS — R7982 Elevated C-reactive protein (CRP): Secondary | ICD-10-CM | POA: Diagnosis not present

## 2017-12-21 DIAGNOSIS — M4802 Spinal stenosis, cervical region: Secondary | ICD-10-CM | POA: Diagnosis not present

## 2017-12-21 DIAGNOSIS — Z96652 Presence of left artificial knee joint: Secondary | ICD-10-CM | POA: Diagnosis not present

## 2017-12-21 DIAGNOSIS — M533 Sacrococcygeal disorders, not elsewhere classified: Secondary | ICD-10-CM | POA: Diagnosis not present

## 2017-12-21 DIAGNOSIS — M961 Postlaminectomy syndrome, not elsewhere classified: Secondary | ICD-10-CM | POA: Diagnosis not present

## 2017-12-21 DIAGNOSIS — M75102 Unspecified rotator cuff tear or rupture of left shoulder, not specified as traumatic: Secondary | ICD-10-CM | POA: Diagnosis not present

## 2017-12-28 DIAGNOSIS — G4700 Insomnia, unspecified: Secondary | ICD-10-CM | POA: Diagnosis not present

## 2017-12-28 DIAGNOSIS — R1011 Right upper quadrant pain: Secondary | ICD-10-CM | POA: Diagnosis not present

## 2017-12-28 DIAGNOSIS — R0602 Shortness of breath: Secondary | ICD-10-CM | POA: Diagnosis not present

## 2017-12-29 DIAGNOSIS — N281 Cyst of kidney, acquired: Secondary | ICD-10-CM | POA: Diagnosis not present

## 2018-01-18 DIAGNOSIS — Z96652 Presence of left artificial knee joint: Secondary | ICD-10-CM | POA: Diagnosis not present

## 2018-01-18 DIAGNOSIS — M533 Sacrococcygeal disorders, not elsewhere classified: Secondary | ICD-10-CM | POA: Diagnosis not present

## 2018-01-18 DIAGNOSIS — M961 Postlaminectomy syndrome, not elsewhere classified: Secondary | ICD-10-CM | POA: Diagnosis not present

## 2018-01-18 DIAGNOSIS — M75102 Unspecified rotator cuff tear or rupture of left shoulder, not specified as traumatic: Secondary | ICD-10-CM | POA: Diagnosis not present

## 2018-01-18 DIAGNOSIS — Z23 Encounter for immunization: Secondary | ICD-10-CM | POA: Diagnosis not present

## 2018-01-18 DIAGNOSIS — M4802 Spinal stenosis, cervical region: Secondary | ICD-10-CM | POA: Diagnosis not present

## 2018-02-02 DIAGNOSIS — R0602 Shortness of breath: Secondary | ICD-10-CM | POA: Diagnosis not present

## 2018-02-02 DIAGNOSIS — E611 Iron deficiency: Secondary | ICD-10-CM | POA: Diagnosis not present

## 2018-02-02 DIAGNOSIS — G4700 Insomnia, unspecified: Secondary | ICD-10-CM | POA: Diagnosis not present

## 2018-02-02 DIAGNOSIS — M797 Fibromyalgia: Secondary | ICD-10-CM | POA: Diagnosis not present

## 2018-02-02 DIAGNOSIS — E039 Hypothyroidism, unspecified: Secondary | ICD-10-CM | POA: Diagnosis not present

## 2018-02-08 DIAGNOSIS — M4802 Spinal stenosis, cervical region: Secondary | ICD-10-CM | POA: Diagnosis not present

## 2018-02-08 DIAGNOSIS — M461 Sacroiliitis, not elsewhere classified: Secondary | ICD-10-CM | POA: Diagnosis not present

## 2018-02-08 DIAGNOSIS — Z79891 Long term (current) use of opiate analgesic: Secondary | ICD-10-CM | POA: Diagnosis not present

## 2018-02-08 DIAGNOSIS — M7918 Myalgia, other site: Secondary | ICD-10-CM | POA: Diagnosis not present

## 2018-02-08 DIAGNOSIS — Z96652 Presence of left artificial knee joint: Secondary | ICD-10-CM | POA: Diagnosis not present

## 2018-02-08 DIAGNOSIS — M533 Sacrococcygeal disorders, not elsewhere classified: Secondary | ICD-10-CM | POA: Diagnosis not present

## 2018-02-08 DIAGNOSIS — M961 Postlaminectomy syndrome, not elsewhere classified: Secondary | ICD-10-CM | POA: Diagnosis not present

## 2018-02-08 DIAGNOSIS — M75102 Unspecified rotator cuff tear or rupture of left shoulder, not specified as traumatic: Secondary | ICD-10-CM | POA: Diagnosis not present

## 2018-03-03 DIAGNOSIS — N281 Cyst of kidney, acquired: Secondary | ICD-10-CM | POA: Diagnosis not present

## 2018-03-08 DIAGNOSIS — E039 Hypothyroidism, unspecified: Secondary | ICD-10-CM | POA: Diagnosis not present

## 2018-03-08 DIAGNOSIS — G4700 Insomnia, unspecified: Secondary | ICD-10-CM | POA: Diagnosis not present

## 2018-03-08 DIAGNOSIS — F419 Anxiety disorder, unspecified: Secondary | ICD-10-CM | POA: Diagnosis not present

## 2018-03-21 DIAGNOSIS — Z79891 Long term (current) use of opiate analgesic: Secondary | ICD-10-CM | POA: Diagnosis not present

## 2018-03-21 DIAGNOSIS — M4802 Spinal stenosis, cervical region: Secondary | ICD-10-CM | POA: Diagnosis not present

## 2018-03-21 DIAGNOSIS — M7918 Myalgia, other site: Secondary | ICD-10-CM | POA: Diagnosis not present

## 2018-03-21 DIAGNOSIS — Z96659 Presence of unspecified artificial knee joint: Secondary | ICD-10-CM | POA: Diagnosis not present

## 2018-03-21 DIAGNOSIS — M533 Sacrococcygeal disorders, not elsewhere classified: Secondary | ICD-10-CM | POA: Diagnosis not present

## 2018-03-21 DIAGNOSIS — M961 Postlaminectomy syndrome, not elsewhere classified: Secondary | ICD-10-CM | POA: Diagnosis not present

## 2018-03-21 DIAGNOSIS — M75122 Complete rotator cuff tear or rupture of left shoulder, not specified as traumatic: Secondary | ICD-10-CM | POA: Diagnosis not present

## 2018-03-28 DIAGNOSIS — F419 Anxiety disorder, unspecified: Secondary | ICD-10-CM | POA: Diagnosis not present

## 2018-03-28 DIAGNOSIS — E039 Hypothyroidism, unspecified: Secondary | ICD-10-CM | POA: Diagnosis not present

## 2018-03-28 DIAGNOSIS — Z205 Contact with and (suspected) exposure to viral hepatitis: Secondary | ICD-10-CM | POA: Diagnosis not present

## 2018-04-17 DIAGNOSIS — M150 Primary generalized (osteo)arthritis: Secondary | ICD-10-CM | POA: Diagnosis not present

## 2018-04-17 DIAGNOSIS — R7982 Elevated C-reactive protein (CRP): Secondary | ICD-10-CM | POA: Diagnosis not present

## 2018-04-17 DIAGNOSIS — M797 Fibromyalgia: Secondary | ICD-10-CM | POA: Diagnosis not present

## 2018-04-17 DIAGNOSIS — Z79899 Other long term (current) drug therapy: Secondary | ICD-10-CM | POA: Diagnosis not present

## 2018-04-28 DIAGNOSIS — M4802 Spinal stenosis, cervical region: Secondary | ICD-10-CM | POA: Diagnosis not present

## 2018-04-28 DIAGNOSIS — M75122 Complete rotator cuff tear or rupture of left shoulder, not specified as traumatic: Secondary | ICD-10-CM | POA: Diagnosis not present

## 2018-04-28 DIAGNOSIS — M961 Postlaminectomy syndrome, not elsewhere classified: Secondary | ICD-10-CM | POA: Diagnosis not present

## 2018-04-28 DIAGNOSIS — M533 Sacrococcygeal disorders, not elsewhere classified: Secondary | ICD-10-CM | POA: Diagnosis not present

## 2018-04-28 DIAGNOSIS — Z96659 Presence of unspecified artificial knee joint: Secondary | ICD-10-CM | POA: Diagnosis not present

## 2018-04-28 DIAGNOSIS — Z79891 Long term (current) use of opiate analgesic: Secondary | ICD-10-CM | POA: Diagnosis not present

## 2018-04-28 DIAGNOSIS — M7918 Myalgia, other site: Secondary | ICD-10-CM | POA: Diagnosis not present

## 2018-05-17 ENCOUNTER — Ambulatory Visit: Payer: Self-pay | Admitting: *Deleted

## 2018-05-17 ENCOUNTER — Encounter: Payer: Self-pay | Admitting: Family Medicine

## 2018-05-17 ENCOUNTER — Ambulatory Visit (INDEPENDENT_AMBULATORY_CARE_PROVIDER_SITE_OTHER): Payer: Medicare Other | Admitting: Family Medicine

## 2018-05-17 VITALS — BP 130/70 | HR 90 | Temp 98.5°F | Resp 12 | Ht 63.0 in | Wt 219.1 lb

## 2018-05-17 DIAGNOSIS — M797 Fibromyalgia: Secondary | ICD-10-CM

## 2018-05-17 DIAGNOSIS — L539 Erythematous condition, unspecified: Secondary | ICD-10-CM

## 2018-05-17 DIAGNOSIS — I872 Venous insufficiency (chronic) (peripheral): Secondary | ICD-10-CM | POA: Insufficient documentation

## 2018-05-17 DIAGNOSIS — G894 Chronic pain syndrome: Secondary | ICD-10-CM | POA: Insufficient documentation

## 2018-05-17 MED ORDER — TRIAMCINOLONE ACETONIDE 0.1 % EX CREA: 1.0000 "application " | TOPICAL_CREAM | Freq: Every day | CUTANEOUS | 0 refills | Status: AC | PRN

## 2018-05-17 MED ORDER — FUROSEMIDE 20 MG PO TABS: ORAL_TABLET | ORAL | 3 refills | Status: DC

## 2018-05-17 NOTE — Telephone Encounter (Signed)
Pt called with complaints of bilateral leg swelling for 1 week (pt has lymphedma); starting the evening of 05/15/2018 her legs became red from the ankle to knee caps (pt says she has history of cellulitis); the pt also complains of felling sweaty; leg pain and itching/burning; she also says that she has been on her feet a lot because she has been taking care of her mother; she also said that she had vancomycin left over from a previous infection, and she took 4 last pm; recommendations made per nurse triage; the pt is scheduled to see Dr Betty [place], as a new pt on 05/22/2018; spoke with [facility] in regards to scheduling the pt; per [facility] the pt will be seen for acute issue only, and will have to come in for new pt appointment; pt offered and accepted appointment with Dr Betty [place]. [facility], 05/17/2018 at 1400; she verbalized understanding; will route to office for notification.   Reason for Disposition . [1] Red area or streak AND [2] fever . [1] MODERATE leg swelling (e.g., swelling extends up to knees) AND [2] new onset or worsening  Answer Assessment - Initial Assessment Questions 1. ONSET: "When did the pain start?"      05/08/2018 2. LOCATION: "Where is the pain located?"      bil legs ankles to feet 3. PAIN: "How bad is the pain?"    (Scale 1-10; or mild, moderate, severe)   -  MILD (1-3): doesn't interfere with normal activities    -  MODERATE (4-7): interferes with normal activities (e.g., work or school) or awakens from sleep, limping    -  SEVERE (8-10): excruciating pain, unable to do any normal activities, unable to walk     \rated 7 out of 10 4. WORK OR EXERCISE: "Has there been any recent work or exercise that involved this part of the body?"      On feet a lot taking chare of mother 5. CAUSE: "What do you think is causing the leg pain?"     Chronic pain 6. OTHER SYMPTOMS: "Do you have any other symptoms?" (e.g., chest pain, back pain, breathing difficulty, swelling, rash, fever,  numbness, weakness)     Leg Swelling worse than ususual  (history of lymphedema); redness from ankles to knees/itching and burning 7. PREGNANCY: "Is there any chance you are pregnant?" "When was your last menstrual period?"   no  Answer Assessment - Initial Assessment Questions 1. ONSET: "When did the swelling start?" (e.g., minutes, hours, days)     days 2. LOCATION: "What part of the leg is swollen?"  "Are both legs swollen or just one leg?"     Both legs (ankle to knee) 3. SEVERITY: "How bad is the swelling?" (e.g., localized; mild, moderate, severe)  - Localized - small area of swelling localized to one leg  - MILD pedal edema - swelling limited to foot and ankle, pitting edema < 1/4 inch (6 mm) deep, rest and elevation eliminate most or all swelling  - MODERATE edema - swelling of lower leg to knee, pitting edema > 1/4 inch (6 mm) deep, rest and elevation only partially reduce swelling  - SEVERE edema - swelling extends above knee, facial or hand swelling present      Moderate (pt has lymphedema) 4. REDNESS: "Does the swelling look red or infected?"     red 5. PAIN: "Is the swelling painful to touch?" If so, ask: "How painful is it?"   (Scale 1-10; mild, moderate or severe)  moderate 6. FEVER: "Do you have a fever?" If so, ask: "What is it, how was it measured, and when did it start?"      Not sure; pt feels sweaty  7. CAUSE: "What do you think is causing the leg swelling?"     History cellulitis 8. MEDICAL HISTORY: "Do you have a history of heart failure, kidney disease, liver failure, or cancer?"     Venous insufficiency 9. RECURRENT SYMPTOM: "Have you had leg swelling before?" If so, ask: "When was the last time?" "What happened that time?"     Yes pt has lymphedema 10. OTHER SYMPTOMS: "Do you have any other symptoms?" (e.g., chest pain, difficulty breathing)       no 11. PREGNANCY: "Is there any chance you are pregnant?" "When was your last menstrual period?"        no  Protocols used: LEG PAIN-A-AH, LEG SWELLING AND EDEMA-A-AH

## 2018-05-17 NOTE — Progress Notes (Signed)
ACUTE VISIT   HPI:  Chief Complaint  Patient presents with  . Bilateral leg swelling    swelling and pain started 1 week ago, itching and redness started Monday    Ms.Birdella Nassif is a 68 y.o. female with history of chronic pain, fibromyalgia,back pain, anxiety,fatigue,and lower extremity lymphedema is here today complaining of worsening lower extremity edema and erythema, bilateral. Bilateral distal lower extremity burning and "itching" sensation. She has had "little" chills but denies high fever.  She states that she has history of "cellulitis", she is allergic to "almost every antibiotic." She states that she was given oral vancomycin to take in case of cellulitis, she has taking 2 tablets for the past 2 days but it has not helped.   She has not tried OTC treatment.  When asked about changes in urine output,she reports decreased urination today. Denies chest pain,orthopnea,PND,gross hematuria,or foam in urine.  Body aches,pain "all over." She is on Cymbalta 60 mg daily. She was following with chronic pain management in [place].   Review of Systems  Constitutional: Positive for chills and fatigue (chronic). Negative for activity change, appetite change and fever.  HENT: Negative for mouth sores, nosebleeds and trouble swallowing.   Eyes: Negative for redness and visual disturbance.  Respiratory: Negative for cough, shortness of breath and wheezing.   Cardiovascular: Negative for chest pain, palpitations and leg swelling.  Gastrointestinal: Negative for abdominal pain, nausea and vomiting.       Negative for changes in bowel habits.  Genitourinary: Positive for decreased urine volume. Negative for dysuria and hematuria.  Musculoskeletal: Positive for back pain and myalgias. Negative for gait problem.  Skin: Positive for rash. Negative for wound.  Neurological: Negative for syncope, weakness, numbness and headaches.  Psychiatric/Behavioral: Negative for confusion.  The patient is nervous/anxious.      Current Outpatient Medications on File Prior to Visit  Medication Sig Dispense Refill  . HYDROcodone-acetaminophen (NORCO/VICODIN) 5-325 MG tablet Take 1 tablet by mouth every 6 (six) hours as needed for moderate pain.     No current facility-administered medications on file prior to visit.      No past medical history on file. Allergies  Allergen Reactions  . Codeine Rash  . Demerol [Meperidine] Other (See Comments)    Ras, nausea, vomiting  . Penicillins     Social History   Socioeconomic History  . Marital status: Widowed    Spouse name: Not on file  . Number of children: Not on file  . Years of education: Not on file  . Highest education level: Not on file  Occupational History  . Not on file  Social Needs  . Financial resource strain: Not on file  . Food insecurity:    Worry: Not on file    Inability: Not on file  . Transportation needs:    Medical: Not on file    Non-medical: Not on file  Tobacco Use  . Smoking status: Unknown If Ever Smoked  Substance and Sexual Activity  . Alcohol use: Not Currently  . Drug use: Not on file  . Sexual activity: Not on file  Lifestyle  . Physical activity:    Days per week: Not on file    Minutes per session: Not on file  . Stress: Not on file  Relationships  . Social connections:    Talks on phone: Not on file    Gets together: Not on file    Attends religious service: Not on file  Active member of club or organization: Not on file    Attends meetings of clubs or organizations: Not on file    Relationship status: Not on file  Other Topics Concern  . Not on file  Social History Narrative  . Not on file    Vitals:   05/17/18 1351  BP: 130/70  Pulse: 90  Resp: 12  Temp: 98.5 F (36.9 C)  SpO2: 95%   Body mass index is 38.82 kg/m.   Physical Exam  Nursing note and vitals reviewed. Constitutional: She is oriented to person, place, and time. She appears  well-developed. No distress.  HENT:  Head: Normocephalic and atraumatic.  Mouth/Throat: Oropharynx is clear and moist and mucous membranes are normal.  Eyes: Pupils are equal, round, and reactive to light. Conjunctivae are normal.  Cardiovascular: Normal rate and regular rhythm.  No murmur heard. Pulses:      Dorsalis pedis pulses are 2+ on the right side and 2+ on the left side.  Respiratory: Effort normal and breath sounds normal. No respiratory distress.  GI: Soft. She exhibits no mass. There is no abdominal tenderness.  Musculoskeletal:        General: Edema (non pitting 2+ LE edema,bilateral) present.  Neurological: She is alert and oriented to person, place, and time. She has normal strength. No cranial nerve deficit. Gait normal.  Skin: Skin is warm. No rash noted. No erythema.  Psychiatric: Her mood appears anxious.  Well groomed, good eye contact.        ASSESSMENT AND PLAN:   Ms. Eber JonesCarolyn was seen today for bilateral leg swelling.  Diagnoses and all orders for this visit:  Lab Results  Component Value Date   WBC 5.4 05/19/2018   HGB 13.2 05/19/2018   HCT 40.5 05/19/2018   MCV 85.6 05/19/2018   PLT 204 05/19/2018   Lab Results  Component Value Date   CREATININE 0.82 05/19/2018   BUN 17 05/19/2018   NA 144 05/19/2018   K 4.1 05/19/2018   CL 108 05/19/2018   CO2 28 05/19/2018     Erythema of lower extremity Educated about diagnosis. I do not think this is caused by a infectious process at this time.  Recommend caution with antibiotic treatment, she is reporting multiple antibiotic allergies.  Explained that oral vancomycin is not absorbed with, so it is not effective for systemic/skin infection. Instructed about warning signs.  -     CBC with Differential/Platelet; Future -     Basic Metabolic Panel; Future  Venous stasis dermatitis of both lower extremities Recommend wearing compression stockings. Furosemide 20 mg twice daily for 5 days then continue  20 mg daily as needed.  We discussed some side effects of diuretics. Further recommendation will be given according to lab results.. Topical steroid may help with itching.  -     furosemide (LASIX) 20 MG tablet; 1 tablet twice daily for 5 days, then 1 tablet daily as needed. -     triamcinolone cream (KENALOG) 0.1 %; Apply 1 application topically daily as needed.  Fibromyalgia syndrome Body aches she is reporting most likely related with this problem. No changes in Cymbalta.   Return if symptoms worsen or fail to improve.      Jarah Pember G. [place], MD  [facility]. [facility].

## 2018-05-17 NOTE — Patient Instructions (Signed)
A few things to remember from today's visit:   Erythema of lower extremity - Plan: CBC with Differential/Platelet, Basic metabolic panel  Venous stasis dermatitis of both lower extremities - Plan: furosemide (LASIX) 20 MG tablet, triamcinolone cream (KENALOG) 0.1 %   Vein disease is a condition that can affect the veins in the legs. It can cause leg pain, varicose veins, swollen legs, or open sores. Varicose veins are swollen and twisted veins. Things that may help: leg exercises (ankle flexion, walking),compression stocking, OTC horse chestnut seed extract 300 mg twice daily, for itchy skin topical steroid and moisturizers.  Compression stockings- [facility] in [place]   Please bring your medication with you .

## 2018-05-17 NOTE — Telephone Encounter (Signed)
Noted  

## 2018-05-19 ENCOUNTER — Other Ambulatory Visit: Payer: Medicare Other

## 2018-05-19 DIAGNOSIS — L539 Erythematous condition, unspecified: Secondary | ICD-10-CM | POA: Diagnosis not present

## 2018-05-20 LAB — CBC WITH DIFFERENTIAL/PLATELET
Absolute Monocytes: 545 cells/uL (ref 200–950)
Basophils Absolute: 32 cells/uL (ref 0–200)
Basophils Relative: 0.6 %
Eosinophils Absolute: 130 cells/uL (ref 15–500)
Eosinophils Relative: 2.4 %
HCT: 40.5 % (ref 35.0–45.0)
HEMOGLOBIN: 13.2 g/dL (ref 11.7–15.5)
Lymphs Abs: 1447 cells/uL (ref 850–3900)
MCH: 27.9 pg (ref 27.0–33.0)
MCHC: 32.6 g/dL (ref 32.0–36.0)
MCV: 85.6 fL (ref 80.0–100.0)
MPV: 12.5 fL (ref 7.5–12.5)
Monocytes Relative: 10.1 %
NEUTROS ABS: 3245 {cells}/uL (ref 1500–7800)
Neutrophils Relative %: 60.1 %
Platelets: 204 10*3/uL (ref 140–400)
RBC: 4.73 10*6/uL (ref 3.80–5.10)
RDW: 12.2 % (ref 11.0–15.0)
Total Lymphocyte: 26.8 %
WBC: 5.4 10*3/uL (ref 3.8–10.8)

## 2018-05-20 LAB — BASIC METABOLIC PANEL
BUN: 17 mg/dL (ref 7–25)
CO2: 28 mmol/L (ref 20–32)
Calcium: 9.4 mg/dL (ref 8.6–10.4)
Chloride: 108 mmol/L (ref 98–110)
Creat: 0.82 mg/dL (ref 0.50–0.99)
Glucose, Bld: 109 mg/dL — ABNORMAL HIGH (ref 65–99)
Potassium: 4.1 mmol/L (ref 3.5–5.3)
Sodium: 144 mmol/L (ref 135–146)

## 2018-05-22 ENCOUNTER — Ambulatory Visit (INDEPENDENT_AMBULATORY_CARE_PROVIDER_SITE_OTHER): Payer: Medicare Other | Admitting: Family Medicine

## 2018-05-22 ENCOUNTER — Ambulatory Visit (INDEPENDENT_AMBULATORY_CARE_PROVIDER_SITE_OTHER): Payer: Medicare Other

## 2018-05-22 ENCOUNTER — Encounter: Payer: Self-pay | Admitting: Family Medicine

## 2018-05-22 VITALS — BP 126/84 | HR 67 | Temp 98.6°F | Resp 12 | Ht 63.0 in | Wt 215.5 lb

## 2018-05-22 DIAGNOSIS — D510 Vitamin B12 deficiency anemia due to intrinsic factor deficiency: Secondary | ICD-10-CM | POA: Diagnosis not present

## 2018-05-22 DIAGNOSIS — M79671 Pain in right foot: Secondary | ICD-10-CM

## 2018-05-22 DIAGNOSIS — D509 Iron deficiency anemia, unspecified: Secondary | ICD-10-CM | POA: Diagnosis not present

## 2018-05-22 DIAGNOSIS — M797 Fibromyalgia: Secondary | ICD-10-CM

## 2018-05-22 DIAGNOSIS — E559 Vitamin D deficiency, unspecified: Secondary | ICD-10-CM

## 2018-05-22 DIAGNOSIS — E039 Hypothyroidism, unspecified: Secondary | ICD-10-CM | POA: Diagnosis not present

## 2018-05-22 DIAGNOSIS — G894 Chronic pain syndrome: Secondary | ICD-10-CM

## 2018-05-22 DIAGNOSIS — M2550 Pain in unspecified joint: Secondary | ICD-10-CM | POA: Diagnosis not present

## 2018-05-22 DIAGNOSIS — M19071 Primary osteoarthritis, right ankle and foot: Secondary | ICD-10-CM | POA: Diagnosis not present

## 2018-05-22 MED ORDER — LEVOTHYROXINE SODIUM 112 MCG PO TABS: 112.0000 ug | ORAL_TABLET | Freq: Every day | ORAL | 0 refills | Status: DC

## 2018-05-22 MED ORDER — FERROUS SULFATE 325 (65 FE) MG PO TABS: 325.0000 mg | ORAL_TABLET | ORAL | 3 refills | Status: DC

## 2018-05-22 MED ORDER — DULOXETINE HCL 30 MG PO CPEP: 30.0000 mg | ORAL_CAPSULE | Freq: Every day | ORAL | 0 refills | Status: AC

## 2018-05-22 MED ORDER — DULOXETINE HCL 60 MG PO CPEP: 60.0000 mg | ORAL_CAPSULE | Freq: Every day | ORAL | 0 refills | Status: DC

## 2018-05-22 NOTE — Progress Notes (Signed)
HPI:   Jessica Deleon is a 68 y.o. female, who is here today to establish care. I saw her on 05/17/18 for acute visit.  Former PCP: Dr Lezlie Lye. Last preventive routine visit: 03/2018. She just moved from [place]. She moved with her mother,she is her caregiver.  Chronic medical problems: Fibromyalgia,anxiety,GERD,Barrett esophaggeneralized OA,diverticulosis s/p colectomy,COPD,and migraines.  Heart murmur,she saw cardiologist 6 months ago. States that she does not need follow ups.   Lymphedema and venous stasis dermatitis: Last visit I recommended Furosemide,she took one tab. Problem has improved since her last visit. Negative for fever,chills,or ulcers.   Migraines: Frontal headaches,bihind her eyes. Associated nausea and photophobia. Exacerbated by stress and lack of sleep. Alleviated by sleeping in a dark and quite room. She is having a migraine today. Headaches are not as frequent, they used to be "very painful",and a few times per year. She is on Topamax.  Fibromyalgia and OA,she was following with rheumatologist,so needs a referral.  She also needs a referral for pain management. She is on Cymbalta 90 mg daily.  Hypothyroidism: She is on Levothyroxine 112 mcg daily. Last f/u with endocrinologist 04/2018.   C/O fatigue,feels "exhausted." She falls asleep easy,has a hard time staying awake while driving. She has Hx of OSA,she is not on CPAP.She had surgical treatment. She does not sleep well,her mother wakes her up a couple time during the night with complains or concerns.  COPD: No Hx of tobacco use but second hand tobacco exposure.  C/O right ankle pain. She thinks she may have twisted ankle when she was moving into her new place a couple weeks ago. She states that she is "known for fractures." Pain and edema are getting worse.  B12 deficiency ,she is not on B12 supplementation.  Iron def anemia on Fe Sulfate 325 mg weekly. She has not noted  blood in stool or melena. She is not sure about etiology. She is not sure about last colonoscopy. States that she has had colectomy x 2 and was told she did not need more colonoscopies done.  Lab Results  Component Value Date   WBC 5.4 05/19/2018   HGB 13.2 05/19/2018   HCT 40.5 05/19/2018   MCV 85.6 05/19/2018   PLT 204 05/19/2018    GERD,last EGD 2-3 years ago. She is not on PPI ,states that she cannot take this type of medications. Occasional heartburn.   Vit D def,not on vit D supplementation. She would like vit D check.    Review of Systems  Constitutional: Positive for fatigue. Negative for activity change, appetite change and fever.  HENT: Negative for mouth sores, nosebleeds and sore throat.   Eyes: Negative for redness and visual disturbance.  Respiratory: Positive for apnea. Negative for cough, shortness of breath and wheezing.   Cardiovascular: Positive for leg swelling. Negative for chest pain and palpitations.  Gastrointestinal: Positive for nausea. Negative for abdominal pain and vomiting.       Negative for changes in bowel habits.  Genitourinary: Negative for decreased urine volume, dysuria and hematuria.  Musculoskeletal: Positive for arthralgias, back pain and myalgias.  Skin: Negative for rash and wound.  Neurological: Positive for headaches. Negative for syncope and weakness.  Psychiatric/Behavioral: Positive for sleep disturbance. Negative for confusion. The patient is nervous/anxious.     Current Outpatient Medications on File Prior to Visit  Medication Sig Dispense Refill  . BUTALBITAL-APAP-CAFFEINE PO Take by mouth. Take 1 tablet by mouth every 4 hours as needed    .  furosemide (LASIX) 20 MG tablet 1 tablet twice daily for 5 days, then 1 tablet daily as needed. 30 tablet 3  . HYDROcodone-acetaminophen (NORCO/VICODIN) 5-325 MG tablet Take 1 tablet by mouth. 1 tablet by mouth three times daily as needed for pain    . ondansetron (ZOFRAN) 8 MG tablet  Take 8 mg by mouth. 1 tablet by mouth daily as needed    . pramipexole (MIRAPEX) 1.5 MG tablet Take 1.5 mg by mouth. Take 2 tablets by mouth daily    . temazepam (RESTORIL) 15 MG capsule Take 15 mg by mouth at bedtime.    Marland Kitchen. tiZANidine (ZANAFLEX) 2 MG tablet Take 2 mg by mouth. 1 to 2 tablets by mouth at bedtime    . topiramate (TOPAMAX) 100 MG tablet Take 100 mg by mouth daily.    Marland Kitchen. triamcinolone cream (KENALOG) 0.1 % Apply 1 application topically daily as needed. 45 g 0   No current facility-administered medications on file prior to visit.      Past Medical History:  Diagnosis Date  . Allergy   . Arthritis   . Diverticulitis   . GERD (gastroesophageal reflux disease)   . Heart murmur   . History of chicken pox   . Migraines   . Seizures (HCC)   . Thyroid disease   . Urine incontinence    Allergies  Allergen Reactions  . Clindamycin/Lincomycin Other (See Comments)    Rash, nausea, vomiting  . Codeine Rash  . Demerol [Meperidine] Other (See Comments)    Ras, nausea, vomiting  . Gadolinium Derivatives Other (See Comments)    Contrast, nausea, vomiting, rash  . Penicillins   . Tape Rash    Family History  Problem Relation Age of Onset  . Arthritis Mother   . Cancer Mother   . Hypertension Mother   . Stroke Mother   . Arthritis Father   . COPD Father   . Diabetes Father   . Early death Father   . Hearing loss Father   . Stroke Father   . Arthritis Daughter   . Cancer Daughter   . Asthma Son     Social History   Socioeconomic History  . Marital status: Widowed    Spouse name: Not on file  . Number of children: 4  . Years of education: Not on file  . Highest education level: Not on file  Occupational History  . Not on file  Social Needs  . Financial resource strain: Not on file  . Food insecurity:    Worry: Not on file    Inability: Not on file  . Transportation needs:    Medical: Not on file    Non-medical: Not on file  Tobacco Use  . Smoking  status: Never Smoker  . Smokeless tobacco: Never Used  Substance and Sexual Activity  . Alcohol use: Not Currently  . Drug use: Never  . Sexual activity: Not Currently  Lifestyle  . Physical activity:    Days per week: Not on file    Minutes per session: Not on file  . Stress: Not on file  Relationships  . Social connections:    Talks on phone: Not on file    Gets together: Not on file    Attends religious service: Not on file    Active member of club or organization: Not on file    Attends meetings of clubs or organizations: Not on file    Relationship status: Not on file  Other Topics Concern  .  Not on file  Social History Narrative  . Not on file    Vitals:   05/22/18 1013  BP: 126/84  Pulse: 67  Resp: 12  Temp: 98.6 F (37 C)  SpO2: 98%    Body mass index is 38.17 kg/m.   Physical Exam  Nursing note and vitals reviewed. Constitutional: She is oriented to person, place, and time. She appears well-developed. No distress.  HENT:  Head: Normocephalic and atraumatic.  Mouth/Throat: Oropharynx is clear and moist and mucous membranes are normal.  Eyes: Pupils are equal, round, and reactive to light. Conjunctivae are normal.  Cardiovascular: Normal rate and regular rhythm.  No murmur heard. Pulses:      Dorsalis pedis pulses are 2+ on the right side and 2+ on the left side.  Respiratory: Effort normal and breath sounds normal. No respiratory distress.  GI: Soft. She exhibits no mass. There is no hepatomegaly. There is no abdominal tenderness.  Musculoskeletal:        General: Edema present.     Right knee: She exhibits normal range of motion. No tenderness found.     Left knee: She exhibits decreased range of motion. No tenderness found.     Right ankle: She exhibits decreased range of motion and swelling. She exhibits no deformity and normal pulse. Tenderness. Medial malleolus tenderness found. No proximal fibula tenderness found.     Cervical back: She exhibits  no tenderness.     Thoracic back: She exhibits no tenderness.     Lumbar back: She exhibits no tenderness.     Right foot: Normal capillary refill. Tenderness present. No crepitus.     Comments: Pain upon palpation of medial malleolus,mid 1st  metatarsus and MTP joint of great toe. Antalgic gait.  Lymphadenopathy:    She has no cervical adenopathy.  Neurological: She is alert and oriented to person, place, and time. She has normal strength. No cranial nerve deficit. Gait normal.  Skin: Skin is warm. No rash noted. No erythema.  Psychiatric: Her mood appears anxious.  Well groomed, good eye contact.    ASSESSMENT AND PLAN:  Jessica Deleon was seen today for establish care.  Diagnoses and all orders for this visit:  Chronic pain disorder Explained I am not going to manage her chronic pain. Pain clinic referral placed.  -     Ambulatory referral to Pain Clinic  Fibromyalgia syndrome Rheumatology referral placed. No changes in current management.  -     Ambulatory referral to Rheumatology -     DULoxetine (CYMBALTA) 30 MG capsule; Take 1 capsule (30 mg total) by mouth daily. -     DULoxetine (CYMBALTA) 60 MG capsule; Take 1 capsule (60 mg total) by mouth daily. 1 capsule by mouth every day along with 30 mg capsule  Pernicious anemia Further recommendations will be given according to B12 results.  -     Vitamin B12  Vitamin D deficiency, unspecified Further recommendations will be given according to 25 OH vit D results.  -     VITAMIN D 25 Hydroxy (Vit-D Deficiency, Fractures)  Polyarthralgia Referral to pain clinic and rheumatologist placed. Cymbalta Rx sent to her pharmacy.  -     Ambulatory referral to Rheumatology  Hypothyroidism, unspecified type Last f/u in 04/2018,reported last TSH in normal range. No changes in current management. Continue following with endocrinologist.  -     Ambulatory referral to Endocrinology -     levothyroxine (SYNTHROID) 112 MCG tablet;  Take 1 tablet (112 mcg  total) by mouth daily before breakfast.  Acute foot pain, right Foot and ankle X ray placed. LE elevation.  -     DG Ankle Complete Right; Future -     DG Foot Complete Right; Future  Iron deficiency anemia, unspecified iron deficiency anemia type No changes in current management. CBC done last visit with normal H/H.  -     ferrous sulfate 325 (65 FE) MG tablet; Take 1 tablet (325 mg total) by mouth once a week.   40 min face to face OV. > 50% was dedicated to discussion of Dx, prognosis, treatment options, and some side effects of medications.  Multiple complaints and medical problems. If she needs to follow or discuss other problem not discuss today she will need to follow sooner.     Return in about 3 months (around 08/20/2018).      Carmello Cabiness G. [place], MD  [facility]. [place] [facility].

## 2018-05-22 NOTE — Patient Instructions (Addendum)
A few things to remember from today's visit:   Chronic pain disorder - Plan: Ambulatory referral to Pain Clinic  Fibromyalgia syndrome - Plan: Ambulatory referral to Rheumatology, DULoxetine (CYMBALTA) 30 MG capsule, DULoxetine (CYMBALTA) 60 MG capsule  Pernicious anemia - Plan: Vitamin B12  Vitamin D deficiency, unspecified - Plan: VITAMIN D 25 Hydroxy (Vit-D Deficiency, Fractures)  Polyarthralgia - Plan: Ambulatory referral to Rheumatology  Hypothyroidism, unspecified type - Plan: Ambulatory referral to Endocrinology, levothyroxine (SYNTHROID) 112 MCG tablet  Acute foot pain, right  Lab Results  Component Value Date   WBC 5.4 05/19/2018   HGB 13.2 05/19/2018   HCT 40.5 05/19/2018   MCV 85.6 05/19/2018   PLT 204 05/19/2018     Please be sure medication list is accurate. If a new problem present, please set up appointment sooner than planned today.

## 2018-05-24 DIAGNOSIS — M19071 Primary osteoarthritis, right ankle and foot: Secondary | ICD-10-CM | POA: Diagnosis not present

## 2018-05-25 ENCOUNTER — Telehealth: Payer: Self-pay | Admitting: *Deleted

## 2018-05-30 ENCOUNTER — Encounter: Payer: Self-pay | Admitting: Sports Medicine

## 2018-05-30 ENCOUNTER — Ambulatory Visit (INDEPENDENT_AMBULATORY_CARE_PROVIDER_SITE_OTHER): Payer: Medicare Other | Admitting: Sports Medicine

## 2018-05-30 VITALS — BP 150/75 | HR 65

## 2018-05-30 DIAGNOSIS — Z8781 Personal history of (healed) traumatic fracture: Secondary | ICD-10-CM

## 2018-05-30 DIAGNOSIS — M2040 Other hammer toe(s) (acquired), unspecified foot: Secondary | ICD-10-CM | POA: Diagnosis not present

## 2018-05-30 DIAGNOSIS — S92401A Displaced unspecified fracture of right great toe, initial encounter for closed fracture: Secondary | ICD-10-CM

## 2018-05-30 DIAGNOSIS — M79671 Pain in right foot: Secondary | ICD-10-CM | POA: Diagnosis not present

## 2018-05-30 DIAGNOSIS — I890 Lymphedema, not elsewhere classified: Secondary | ICD-10-CM | POA: Diagnosis not present

## 2018-05-30 NOTE — Progress Notes (Signed)
Subjective: Maudeen Kimpel is a 68 y.o. female patient who presents to office for evaluation of Right foot pain. Patient complains of progressive pain especially over the last 3weeks in the Right foot after stepping the wrong way. Patient has tried going to PCP with no relief in symptoms. Patient denies any other pedal complaints.   Review of Systems  Musculoskeletal: Positive for back pain and joint pain.  All other systems reviewed and are negative.    Patient Active Problem List   Diagnosis Date Noted  . Pernicious anemia 05/22/2018  . Vitamin D deficiency, unspecified 05/22/2018  . Venous stasis dermatitis of both lower extremities 05/17/2018  . Erythema of lower extremity 05/17/2018  . Fibromyalgia syndrome 05/17/2018  . Chronic pain disorder 05/17/2018    Current Outpatient Medications on File Prior to Visit  Medication Sig Dispense Refill  . BUTALBITAL-APAP-CAFFEINE PO Take by mouth. Take 1 tablet by mouth every 4 hours as needed    . DULoxetine (CYMBALTA) 30 MG capsule Take 1 capsule (30 mg total) by mouth daily. 90 capsule 0  . DULoxetine (CYMBALTA) 60 MG capsule Take 1 capsule (60 mg total) by mouth daily. 1 capsule by mouth every day along with 30 mg capsule 90 capsule 0  . ferrous sulfate 325 (65 FE) MG tablet Take 1 tablet (325 mg total) by mouth once a week. 15 tablet 3  . furosemide (LASIX) 20 MG tablet 1 tablet twice daily for 5 days, then 1 tablet daily as needed. 30 tablet 3  . HYDROcodone-acetaminophen (NORCO/VICODIN) 5-325 MG tablet Take 1 tablet by mouth. 1 tablet by mouth three times daily as needed for pain    . levothyroxine (SYNTHROID) 112 MCG tablet Take 1 tablet (112 mcg total) by mouth daily before breakfast. 90 tablet 0  . ondansetron (ZOFRAN) 8 MG tablet Take 8 mg by mouth. 1 tablet by mouth daily as needed    . pramipexole (MIRAPEX) 1.5 MG tablet Take 1.5 mg by mouth. Take 2 tablets by mouth daily    . predniSONE (STERAPRED UNI-PAK 48 TAB) 5 MG (48)  TBPK tablet See admin instructions.    . temazepam (RESTORIL) 15 MG capsule Take 15 mg by mouth at bedtime.    Marland Kitchen tiZANidine (ZANAFLEX) 2 MG tablet Take 2 mg by mouth. 1 to 2 tablets by mouth at bedtime    . topiramate (TOPAMAX) 100 MG tablet Take 100 mg by mouth daily.    Marland Kitchen triamcinolone cream (KENALOG) 0.1 % Apply 1 application topically daily as needed. 45 g 0   No current facility-administered medications on file prior to visit.     Allergies  Allergen Reactions  . Clindamycin/Lincomycin Other (See Comments)    Rash, nausea, vomiting  . Codeine Rash  . Demerol [Meperidine] Other (See Comments)    Ras, nausea, vomiting  . Gadolinium Derivatives Other (See Comments)    Contrast, nausea, vomiting, rash  . Penicillins   . Tape Rash    Objective:  General: Alert and oriented x3 in no acute distress  Dermatology: No open lesions bilateral lower extremities, no webspace macerations, no ecchymosis bilateral, all nails x 10 are well manicured.  Vascular: Focal edema noted to right foot. But chronic on legs due to lymphedema, Dorsalis Pedis and Posterior Tibial pedal pulses 0/4 due to edema, Capillary Fill Time 5 seconds,(+) scant pedal hair growth bilateral, Temperature gradient within normal limits.  Neurology: Michaell Cowing sensation intact via light touch bilateral.  Musculoskeletal: There is tenderness with palpation at Hallux on  Right foot, Lesser digital deformity,No pain with calf compression bilateral. All joint range of motion is within normal limits, Strength within normal limits in all groups bilateral.   Gait: Unassisted, Antalgic gait    Assessment and Plan: Problem List Items Addressed This Visit    None    Visit Diagnoses    Closed non-physeal fracture of phalanx of right great toe, unspecified phalanx, initial encounter    -  Primary   History of fracture of right ankle       Old   Lymphedema       Hammer toe, unspecified laterality       Right foot pain            -Complete examination performed -Xrays reviewed on CD from 05-22-18 revealing great toe fracture nondisplaced and history of old ankle fracture on right -Discussed treatement options for fracture; risks, alternatives, and benefits explained. -Applied toe splint and advised stiff sole shoe   -Recommend protection, rest, ice, elevation daily until symptoms improve -Continue with long term meds for pain  -Patient to return to office in 4-6 weeks for serial x-rays to assess healing  or sooner if condition worsens. Advised patient that once her toe fracture heals we can further talk about treatment for her lesser toes.   Asencion Islam, DPM

## 2018-05-31 ENCOUNTER — Ambulatory Visit (INDEPENDENT_AMBULATORY_CARE_PROVIDER_SITE_OTHER): Payer: Medicare Other

## 2018-05-31 ENCOUNTER — Encounter: Payer: Self-pay | Admitting: Family Medicine

## 2018-05-31 ENCOUNTER — Ambulatory Visit (INDEPENDENT_AMBULATORY_CARE_PROVIDER_SITE_OTHER): Payer: Medicare Other | Admitting: Family Medicine

## 2018-05-31 VITALS — BP 126/82 | HR 89 | Temp 98.4°F | Resp 16 | Ht 63.0 in | Wt 215.2 lb

## 2018-05-31 DIAGNOSIS — J069 Acute upper respiratory infection, unspecified: Secondary | ICD-10-CM

## 2018-05-31 DIAGNOSIS — R0989 Other specified symptoms and signs involving the circulatory and respiratory systems: Secondary | ICD-10-CM

## 2018-05-31 DIAGNOSIS — R059 Cough, unspecified: Secondary | ICD-10-CM

## 2018-05-31 DIAGNOSIS — J989 Respiratory disorder, unspecified: Secondary | ICD-10-CM

## 2018-05-31 DIAGNOSIS — R05 Cough: Secondary | ICD-10-CM

## 2018-05-31 LAB — POCT INFLUENZA A/B
INFLUENZA B, POC: NEGATIVE
Influenza A, POC: NEGATIVE

## 2018-05-31 MED ORDER — HYDROCODONE-HOMATROPINE 5-1.5 MG/5ML PO SYRP: 5.0000 mL | ORAL_SOLUTION | Freq: Two times a day (BID) | ORAL | 0 refills | Status: AC | PRN

## 2018-05-31 MED ORDER — BENZONATATE 100 MG PO CAPS: 200.0000 mg | ORAL_CAPSULE | Freq: Two times a day (BID) | ORAL | 0 refills | Status: AC | PRN

## 2018-05-31 NOTE — Patient Instructions (Addendum)
A few things to remember from today's visit:   Reactive airway disease that is not asthma - Plan: DG Chest 2 View  Cough - Plan: POC Influenza A/B, DG Chest 2 View, benzonatate (TESSALON) 100 MG capsule, HYDROcodone-homatropine (HYCODAN) 5-1.5 MG/5ML syrup  URI, acute  Today I do not hear pneumonia or bronchitis no wheezing. I think you have a viral illness, symptomatic treatment recommended for now.  Antibiotics will not prevent complications or pneumonia. Albuterol inh 2 puff every 6 hours for a week then as needed for wheezing or shortness of breath.  Monitor for new symptoms, because this problem just started, you might start with diarrhea, nausea, vomiting, or more upper respiratory symptoms.  Follow-up in 1 to 2 weeks if you are not feeling any better.  Please be sure medication list is accurate. If a new problem present, please set up appointment sooner than planned today.

## 2018-05-31 NOTE — Progress Notes (Signed)
ACUTE VISIT  HPI:  Chief Complaint  Patient presents with  . Cough    cough with yellow mucus that started this morning  . Wheezing    Jessica Deleon is a 68 y.o.female here today complaining of less than 24 hours of respiratory symptoms. This morning she started with productive cough with yellowish sputum,denies hemoptysis. Intermittent wheezing. Symptoms exacerbated by exertion. Alleviated by [facility] ,last used am today.  She states that her former PCP "always" prescribe antibiotics to prevent bronchitis.  URI   This is a new problem. The current episode started today. The problem has been unchanged. There has been no fever. Associated symptoms include congestion, coughing, ear pain, a plugged ear sensation, rhinorrhea, a sore throat and wheezing. Pertinent negatives include no abdominal pain, chest pain, diarrhea, headaches, nausea, rash or vomiting. She has tried inhaler use for the symptoms. The treatment provided mild relief.   Hx of chronic bronchitis.  No Hx of recent travel. No sick contact. No known insect bite.   OTC medications for this problem: None  Review of Systems  Constitutional: Positive for appetite change, chills and fatigue. Negative for activity change and fever.  HENT: Positive for congestion, ear pain, postnasal drip, rhinorrhea and sore throat. Negative for mouth sores, sinus pressure and trouble swallowing.   Eyes: Negative for discharge and redness.  Respiratory: Positive for cough and wheezing. Negative for shortness of breath.   Cardiovascular: Negative for chest pain.  Gastrointestinal: Negative for abdominal pain, diarrhea, nausea and vomiting.  Musculoskeletal: Positive for arthralgias and myalgias. Negative for gait problem.  Skin: Negative for rash.  Allergic/Immunologic: Positive for environmental allergies.  Neurological: Negative for syncope, weakness and headaches.  Hematological: Negative for adenopathy. Does not  bruise/bleed easily.  Psychiatric/Behavioral: Negative for confusion. The patient is nervous/anxious.       Current Outpatient Medications on File Prior to Visit  Medication Sig Dispense Refill  . BUTALBITAL-APAP-CAFFEINE PO Take by mouth. Take 1 tablet by mouth every 4 hours as needed    . DULoxetine (CYMBALTA) 30 MG capsule Take 1 capsule (30 mg total) by mouth daily. 90 capsule 0  . DULoxetine (CYMBALTA) 60 MG capsule Take 1 capsule (60 mg total) by mouth daily. 1 capsule by mouth every day along with 30 mg capsule 90 capsule 0  . ferrous sulfate 325 (65 FE) MG tablet Take 1 tablet (325 mg total) by mouth once a week. 15 tablet 3  . furosemide (LASIX) 20 MG tablet 1 tablet twice daily for 5 days, then 1 tablet daily as needed. 30 tablet 3  . HYDROcodone-acetaminophen (NORCO/VICODIN) 5-325 MG tablet Take 1 tablet by mouth. 1 tablet by mouth three times daily as needed for pain    . levothyroxine (SYNTHROID) 112 MCG tablet Take 1 tablet (112 mcg total) by mouth daily before breakfast. 90 tablet 0  . ondansetron (ZOFRAN) 8 MG tablet Take 8 mg by mouth. 1 tablet by mouth daily as needed    . pramipexole (MIRAPEX) 1.5 MG tablet Take 1.5 mg by mouth. Take 2 tablets by mouth daily    . temazepam (RESTORIL) 15 MG capsule Take 15 mg by mouth at bedtime.    Marland Kitchen tiZANidine (ZANAFLEX) 2 MG tablet Take 2 mg by mouth. 1 to 2 tablets by mouth at bedtime    . topiramate (TOPAMAX) 100 MG tablet Take 100 mg by mouth daily.    Marland Kitchen triamcinolone cream (KENALOG) 0.1 % Apply 1 application topically daily as  needed. 45 g 0  . predniSONE (STERAPRED UNI-PAK 48 TAB) 5 MG (48) TBPK tablet See admin instructions.      No current facility-administered medications on file prior to visit.      Past Medical History:  Diagnosis Date  . Allergy   . Arthritis   . Diverticulitis   . GERD (gastroesophageal reflux disease)   . Heart murmur   . History of chicken pox   . Migraines   . Seizures (HCC)   . Thyroid disease    . Urine incontinence    Allergies  Allergen Reactions  . Clindamycin/Lincomycin Other (See Comments)    Rash, nausea, vomiting  . Codeine Rash  . Demerol [Meperidine] Other (See Comments)    Ras, nausea, vomiting  . Gadolinium Derivatives Other (See Comments)    Contrast, nausea, vomiting, rash  . Penicillins   . Tape Rash    Social History   Socioeconomic History  . Marital status: Widowed    Spouse name: Not on file  . Number of children: 4  . Years of education: Not on file  . Highest education level: Not on file  Occupational History  . Not on file  Social Needs  . Financial resource strain: Not on file  . Food insecurity:    Worry: Not on file    Inability: Not on file  . Transportation needs:    Medical: Not on file    Non-medical: Not on file  Tobacco Use  . Smoking status: Never Smoker  . Smokeless tobacco: Never Used  Substance and Sexual Activity  . Alcohol use: Not Currently  . Drug use: Never  . Sexual activity: Not Currently  Lifestyle  . Physical activity:    Days per week: Not on file    Minutes per session: Not on file  . Stress: Not on file  Relationships  . Social connections:    Talks on phone: Not on file    Gets together: Not on file    Attends religious service: Not on file    Active member of club or organization: Not on file    Attends meetings of clubs or organizations: Not on file    Relationship status: Not on file  Other Topics Concern  . Not on file  Social History Narrative  . Not on file    Vitals:   05/31/18 1606  BP: 126/82  Pulse: 89  Resp: 16  Temp: 98.4 F (36.9 C)  SpO2: 98%   Body mass index is 38.13 kg/m.   Physical Exam  Nursing note and vitals reviewed. Constitutional: She is oriented to person, place, and time. She appears well-developed. She does not appear ill. No distress.  HENT:  Head: Normocephalic and atraumatic.  Right Ear: Tympanic membrane, external ear and ear canal normal.  Left Ear:  Tympanic membrane, external ear and ear canal normal.  Nose: Rhinorrhea present. Right sinus exhibits no maxillary sinus tenderness and no frontal sinus tenderness. Left sinus exhibits no maxillary sinus tenderness and no frontal sinus tenderness.  Mouth/Throat: Oropharynx is clear and moist and mucous membranes are normal.  Eyes: Conjunctivae are normal.  Neck: No muscular tenderness present. No edema and no erythema present.  Cardiovascular: Normal rate and regular rhythm.  No murmur heard. Respiratory: Effort normal and breath sounds normal. No stridor. No respiratory distress.  Lymphadenopathy:       Head (right side): No submandibular adenopathy present.       Head (left side): No submandibular adenopathy  present.    She has no cervical adenopathy.  Neurological: She is alert and oriented to person, place, and time. She has normal strength. Gait normal.  Skin: Skin is warm. No rash noted. No erythema.  Psychiatric: Her mood appears anxious.  Well groomed, good eye contact.      ASSESSMENT AND PLAN:  Jessica Deleon was seen today for cough and wheezing.  Diagnoses and all orders for this visit:  Reactive airway disease that is not asthma No wheezing heard today. Albuterol inh 2 puff every 6 hours for a week then as needed for wheezing or shortness of breath.  I do not think oral Prednisone is needed at this time. Instructed about warning signs.  -     DG Chest 2 View; Future  URI, acute Rapid flu test negative. Symptoms suggests a viral etiology, I explained that symptomatic treatment is usually recommended in this case, so I do not think abx is needed at this time. Monitor for new symptoms since problem just started today. Instructed to monitor for signs of complications, clearly instructed about warning signs.  F/U as needed.  Cough Lung auscultation today negative for rales,rhonchi,or wheezing. Symptomatic treatment recommended for now. Instructed about warning  signs. I also explained that cough and nasal congestion can last a few days and sometimes weeks. Because concerned about serious lung process, CXR was ordered.  -     POC Influenza A/B -     DG Chest 2 View; Future -     benzonatate (TESSALON) 100 MG capsule; Take 2 capsules (200 mg total) by mouth 2 (two) times daily as needed for up to 10 days. -     HYDROcodone-homatropine (HYCODAN) 5-1.5 MG/5ML syrup; Take 5 mLs by mouth every 12 (twelve) hours as needed for up to 10 days.     Return if symptoms worsen or fail to improve.      Betty G. [place], MD  [facility]. [place] office.

## 2018-06-04 ENCOUNTER — Encounter: Payer: Self-pay | Admitting: Family Medicine

## 2018-06-08 ENCOUNTER — Ambulatory Visit: Payer: Medicare Other | Admitting: Endocrinology

## 2018-06-13 ENCOUNTER — Encounter: Payer: Self-pay | Admitting: Physical Medicine & Rehabilitation

## 2018-06-20 ENCOUNTER — Encounter: Payer: Self-pay | Admitting: Sports Medicine

## 2018-06-20 ENCOUNTER — Ambulatory Visit (INDEPENDENT_AMBULATORY_CARE_PROVIDER_SITE_OTHER): Payer: Medicare Other | Admitting: Sports Medicine

## 2018-06-20 ENCOUNTER — Other Ambulatory Visit: Payer: Self-pay | Admitting: Sports Medicine

## 2018-06-20 ENCOUNTER — Ambulatory Visit (INDEPENDENT_AMBULATORY_CARE_PROVIDER_SITE_OTHER): Payer: Medicare Other

## 2018-06-20 DIAGNOSIS — I890 Lymphedema, not elsewhere classified: Secondary | ICD-10-CM

## 2018-06-20 DIAGNOSIS — S92401G Displaced unspecified fracture of right great toe, subsequent encounter for fracture with delayed healing: Secondary | ICD-10-CM

## 2018-06-20 DIAGNOSIS — G8929 Other chronic pain: Secondary | ICD-10-CM

## 2018-06-20 DIAGNOSIS — M779 Enthesopathy, unspecified: Secondary | ICD-10-CM

## 2018-06-20 DIAGNOSIS — M25571 Pain in right ankle and joints of right foot: Secondary | ICD-10-CM

## 2018-06-20 DIAGNOSIS — Z8781 Personal history of (healed) traumatic fracture: Secondary | ICD-10-CM

## 2018-06-20 DIAGNOSIS — M79671 Pain in right foot: Secondary | ICD-10-CM

## 2018-06-20 MED ORDER — TRIAMCINOLONE ACETONIDE 10 MG/ML IJ SUSP
10.0000 mg | Freq: Once | INTRAMUSCULAR | Status: AC
  Administered 2018-06-20: 10 mg

## 2018-06-20 NOTE — Progress Notes (Signed)
Subjective: Jessica Deleon is a 68 y.o. female patient who returns to office for evaluation of Right foot pain at great toe and also complaints of worsening right ankle pain.  Patient has a history of fracture at the first toe and previous history of fracture at the ankle patient states that her ankle is hurting more so than her toe at this visit.  Patient reports that she has been consistent with splinting her toe and that her toe feels much better however is concerned about her ankle and has other concerns about the deviation of her smaller toes with hammering and rubbing and also wants to discuss the future surgery of getting these toes taken care of as well. Patient denies any other pedal complaints.    Patient Active Problem List   Diagnosis Date Noted  . Pernicious anemia 05/22/2018  . Vitamin D deficiency, unspecified 05/22/2018  . Venous stasis dermatitis of both lower extremities 05/17/2018  . Erythema of lower extremity 05/17/2018  . Fibromyalgia syndrome 05/17/2018  . Chronic pain disorder 05/17/2018    Current Outpatient Medications on File Prior to Visit  Medication Sig Dispense Refill  . BUTALBITAL-APAP-CAFFEINE PO Take by mouth. Take 1 tablet by mouth every 4 hours as needed    . DULoxetine (CYMBALTA) 30 MG capsule Take 1 capsule (30 mg total) by mouth daily. 90 capsule 0  . DULoxetine (CYMBALTA) 60 MG capsule Take 1 capsule (60 mg total) by mouth daily. 1 capsule by mouth every day along with 30 mg capsule 90 capsule 0  . ferrous sulfate 325 (65 FE) MG tablet Take 1 tablet (325 mg total) by mouth once a week. 15 tablet 3  . furosemide (LASIX) 20 MG tablet 1 tablet twice daily for 5 days, then 1 tablet daily as needed. 30 tablet 3  . HYDROcodone-acetaminophen (NORCO/VICODIN) 5-325 MG tablet Take 1 tablet by mouth. 1 tablet by mouth three times daily as needed for pain    . levothyroxine (SYNTHROID) 112 MCG tablet Take 1 tablet (112 mcg total) by mouth daily before breakfast.  90 tablet 0  . ondansetron (ZOFRAN) 8 MG tablet Take 8 mg by mouth. 1 tablet by mouth daily as needed    . pramipexole (MIRAPEX) 1.5 MG tablet Take 1.5 mg by mouth. Take 2 tablets by mouth daily    . predniSONE (STERAPRED UNI-PAK 48 TAB) 5 MG (48) TBPK tablet See admin instructions.     . temazepam (RESTORIL) 15 MG capsule Take 15 mg by mouth at bedtime.    Marland Kitchen tiZANidine (ZANAFLEX) 2 MG tablet Take 2 mg by mouth. 1 to 2 tablets by mouth at bedtime    . topiramate (TOPAMAX) 100 MG tablet Take 100 mg by mouth daily.    Marland Kitchen triamcinolone cream (KENALOG) 0.1 % Apply 1 application topically daily as needed. 45 g 0   No current facility-administered medications on file prior to visit.     Allergies  Allergen Reactions  . Clindamycin/Lincomycin Other (See Comments)    Rash, nausea, vomiting  . Codeine Rash  . Demerol [Meperidine] Other (See Comments)    Ras, nausea, vomiting  . Gadolinium Derivatives Other (See Comments)    Contrast, nausea, vomiting, rash  . Penicillins   . Tape Rash    Objective:  General: Alert and oriented x3 in no acute distress  Dermatology: No open lesions bilateral lower extremities, old scar at right medial ankle, no webspace macerations, no ecchymosis bilateral, all nails x 10 are well manicured.  Vascular: Focal  edema noted to right foot. But chronic on legs due to lymphedema, Dorsalis Pedis and Posterior Tibial pedal pulses 0/4 due to edema, Capillary Fill Time 5 seconds,(+) scant pedal hair growth bilateral, Temperature gradient within normal limits.  Neurology: Michaell Cowing sensation intact via light touch bilateral.  Musculoskeletal: There is no tenderness with palpation at Hallux on Right foot, Lesser digital deformity with splaying hammertoe there is significant pain today noted at the medial ankle,No pain with calf compression bilateral. All joint range of motion is within normal limits except medial ankle where there is guarding due to pain with previous history  of ankle fracture, Strength within normal limits in all groups bilateral.   Gait: Unassisted, Antalgic gait  X-ray right foot and views of ankle that can be seen reveal healed great toe fracture significant lesser toe deviation and hammertoe there is mild joint space narrowing supportive of arthritis at the ankle however no repeat fracture or break and significant soft tissue swelling and chronic lymphedema/patient habitus.    Assessment and Plan: Problem List Items Addressed This Visit    None    Visit Diagnoses    Closed non-physeal fracture of phalanx of right great toe with delayed healing, unspecified phalanx, subsequent encounter    -  Primary   Healed   Chronic pain of right ankle       Relevant Medications   triamcinolone acetonide (KENALOG) 10 MG/ML injection 10 mg (Completed) (Start on 06/20/2018  8:45 PM)   Capsulitis       Relevant Medications   triamcinolone acetonide (KENALOG) 10 MG/ML injection 10 mg (Completed) (Start on 06/20/2018  8:45 PM)   History of fracture of right ankle       Lymphedema           -Complete examination performed -Xrays reviewed -Discussed treatement options for fracture at right great toe that appears healed and possible capsulitis at right ankle versus worsening of symptoms and arthritis from a history of old fracture; risks, alternatives, and benefits explained. -Patient no longer needs toe splint at right great toe -After oral consent and aseptic prep, injected a mixture containing 1 ml of 2%  plain lidocaine, 1 ml 0.5% plain marcaine, 0.5 ml of kenalog 10 and 0.5 ml of dexamethasone phosphate into -at right medial ankle without complication. Post-injection care discussed with patient.  -Recommend protection, rest, ice, elevation daily until symptoms improve -Order MRI for further evaluation of ankle due to chronic history of pain and previous history of fracture to see if there are any underlying concerns at this joint -Continue with long  term meds for pain of which patient is on for other pain issues -Advised patient that once we get the MRI of her right ankle if this is negative then we can further talk about procedures to help with her hammertoes and plan this accordingly.   Asencion Islam, DPM

## 2018-06-21 ENCOUNTER — Telehealth: Payer: Self-pay | Admitting: *Deleted

## 2018-06-21 DIAGNOSIS — Z8781 Personal history of (healed) traumatic fracture: Secondary | ICD-10-CM

## 2018-06-21 DIAGNOSIS — S92401G Displaced unspecified fracture of right great toe, subsequent encounter for fracture with delayed healing: Secondary | ICD-10-CM

## 2018-06-21 DIAGNOSIS — G8929 Other chronic pain: Secondary | ICD-10-CM

## 2018-06-21 DIAGNOSIS — M25571 Pain in right ankle and joints of right foot: Secondary | ICD-10-CM

## 2018-06-21 DIAGNOSIS — M779 Enthesopathy, unspecified: Secondary | ICD-10-CM

## 2018-06-21 NOTE — Telephone Encounter (Signed)
-----   Message from Asencion Islam, [place] sent at 06/20/2018  8:37 PM EDT ----- Regarding: MRI right ankle Medial ankle pain at area of previous surgery and scar with history of right ankle fracture MRI to evaluate for any subsequent tendon or joint involvement since x-rays are negative of any acute findings

## 2018-06-21 NOTE — Telephone Encounter (Signed)
Faxed orders to [facility]. 

## 2018-06-27 ENCOUNTER — Ambulatory Visit: Payer: Medicare Other | Admitting: Sports Medicine

## 2018-07-07 ENCOUNTER — Other Ambulatory Visit: Payer: Self-pay

## 2018-07-07 ENCOUNTER — Ambulatory Visit (HOSPITAL_BASED_OUTPATIENT_CLINIC_OR_DEPARTMENT_OTHER): Payer: Medicare Other | Admitting: Physical Medicine & Rehabilitation

## 2018-07-07 ENCOUNTER — Encounter: Payer: Medicare Other | Attending: Physical Medicine & Rehabilitation

## 2018-07-07 ENCOUNTER — Telehealth: Payer: Self-pay | Admitting: Sports Medicine

## 2018-07-07 ENCOUNTER — Encounter: Payer: Self-pay | Admitting: Physical Medicine & Rehabilitation

## 2018-07-07 VITALS — BP 139/84 | HR 76 | Temp 98.0°F | Ht 63.0 in | Wt 212.0 lb

## 2018-07-07 DIAGNOSIS — M25571 Pain in right ankle and joints of right foot: Secondary | ICD-10-CM

## 2018-07-07 DIAGNOSIS — Z8781 Personal history of (healed) traumatic fracture: Secondary | ICD-10-CM

## 2018-07-07 DIAGNOSIS — S92401G Displaced unspecified fracture of right great toe, subsequent encounter for fracture with delayed healing: Secondary | ICD-10-CM

## 2018-07-07 DIAGNOSIS — G8929 Other chronic pain: Secondary | ICD-10-CM

## 2018-07-07 DIAGNOSIS — Z5181 Encounter for therapeutic drug level monitoring: Secondary | ICD-10-CM | POA: Diagnosis not present

## 2018-07-07 DIAGNOSIS — M779 Enthesopathy, unspecified: Secondary | ICD-10-CM

## 2018-07-07 NOTE — Telephone Encounter (Signed)
MRI Ankle without contrast

## 2018-07-07 NOTE — Patient Instructions (Signed)
If Urine screen is ok I can prescribe the hydrocodone nightly  Next visit is injection of nerves for

## 2018-07-07 NOTE — Telephone Encounter (Signed)
Pt was told to notify her doctor when she was back in town so that she could schedule an MRI of her ankle. Please give patient a call.

## 2018-07-07 NOTE — Progress Notes (Signed)
Subjective:    Patient ID: Jessica Deleon, female    DOB: 11/18/1950, 68 y.o.   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  She has tried numerous medications including hydrocodone, morphine, oxycodone, OxyContin, gabapentin which were all ineffective.  She was tried on nonsteroidals but was stopped due to liver issues.  There is no history of liver failure however.  She had tried Lyrica but this caused weight gain and amitriptyline was also ineffective.  Her latest regimen at their clinic was hydrocodone 5 mg 3 times a day Topamax 150 mg twice a day tizanidine 2 mg, 2 tablets at bedtime, Cymbalta 60 mg once a day as needed Fioricet. She has undergone in the past greater occipital nerve block in 2013 and 2014 which was helpful She had tried lumbar medial branch radiofrequency ablation in 2015 without significant relief she has had caudal epidural 06/13/2013 with 75% relief but repeated on 07/16/2013 without significant relief.  Bilateral knee injections 08/27/2013 with 2 weeks of relief caudal epidural on 01/21/2014 as well as 05/03/2014 with 80% improvement in pain. Has been also evaluated by a rheumatologist who recommended psychological testing. Past surgical history left total knee replacement 07/02/2014 She has also had left shoulder debridement with arthroscopic decompression 07/30/2016 She  has not responded well to cervical epidural steroid injections done on 2 occasions 2017 She did respond to caudal epidural injection 01/12/2017 with 75% relief  Subsequent cervical epidural steroid injection at C6-C7 10/04/2016 gave 75% relief She underwent bilateral sacroiliac joint injection 05/25/2017 without improvement from the injection She underwent bilateral sacroiliac injection 02/08/2018 with 75% relief She has had multiple appropriate UDS is from 10/31/2012 to 11/14/2017 but 1 UDS on 03/21/2018 which was inappropriately negative.  EMG/NCV 12/01/2015 mild C6-C7 radiculopathy on left side mild carpal tunnel on the right side Social husband deceased she is now taking care of her elderly mother Pain Inventory Average Pain 7 Pain Right Now 8 My pain is sharp, burning, stabbing, tingling and aching  In the last 24 hours, has pain interfered with the following? General activity 5 Relation with others 2 Enjoyment of life 3 What TIME of day is your pain at its worst? night Sleep (in general) Poor  Pain is worse with: walking, bending, sitting and some activites Pain improves with: rest, heat/ice, pacing activities, medication and injections Relief from Meds: 8  Mobility walk without assistance ability to climb steps?  yes do you drive?  yes  Function disabled: date disabled 1 retired  Neuro/Psych bowel control problems spasms  Prior Studies New Patient  Physicians involved in your care New Patient   Family History  Problem Relation Age of Onset  . Arthritis Mother   . Cancer Mother   . Hypertension Mother   . Stroke Mother   . Arthritis Father   . COPD Father   . Diabetes Father   .  Early death Father   . Hearing loss Father   . Stroke Father   . Arthritis Daughter   . Cancer Daughter   . Asthma Son    Social History   Socioeconomic History  . Marital status: Widowed    Spouse name: Not on file  . Number of children: 4  . Years of education: Not on  file  . Highest education level: Not on file  Occupational History  . Not on file  Social Needs  . Financial resource strain: Not on file  . Food insecurity:    Worry: Not on file    Inability: Not on file  . Transportation needs:    Medical: Not on file    Non-medical: Not on file  Tobacco Use  . Smoking status: Never Smoker  . Smokeless tobacco: Never Used  Substance and Sexual Activity  . Alcohol use: Never    Frequency: Never  . Drug use: Never  . Sexual activity: Not Currently  Lifestyle  . Physical activity:    Days per week: Not on file    Minutes per session: Not on file  . Stress: Not on file  Relationships  . Social connections:    Talks on phone: Not on file    Gets together: Not on file    Attends religious service: Not on file    Active member of club or organization: Not on file    Attends meetings of clubs or organizations: Not on file    Relationship status: Not on file  Other Topics Concern  . Not on file  Social History Narrative  . Not on file   Past Surgical History:  Procedure Laterality Date  . ABDOMINAL HYSTERECTOMY  1983  . APPENDECTOMY  1960  . BONE TUMOR EXCISION  1977  . CHOLECYSTECTOMY  2008  . REPLACEMENT TOTAL KNEE BILATERAL    . TONSILLECTOMY AND ADENOIDECTOMY  1954   Past Medical History:  Diagnosis Date  . Allergy   . Arthritis   . Diverticulitis   . GERD (gastroesophageal reflux disease)   . Heart murmur   . History of chicken pox   . Migraines   . Seizures (HCC)   . Thyroid disease   . Urine incontinence    BP 139/84   Pulse 76   Temp 98 F (36.7 C)   Ht 5\' 3"  (1.6 m)   Wt 212 lb (96.2 kg)   SpO2 96%   BMI 37.55 kg/m   Opioid Risk Score:   Fall Risk Score:  `1  Depression screen PHQ 2/9  No flowsheet data found.   Review of Systems  Constitutional: Negative.   HENT: Negative.   Eyes: Negative.   Respiratory: Negative.   Cardiovascular: Negative.   Gastrointestinal: Negative.   Endocrine: Negative.    Genitourinary: Negative.   Musculoskeletal: Positive for arthralgias, back pain, gait problem, joint swelling and myalgias.  Skin: Negative.   Allergic/Immunologic: Negative.   Hematological: Negative.   Psychiatric/Behavioral: Negative.   All other systems reviewed and are negative.      Objective:   Physical Exam Vitals signs and nursing note reviewed.  Constitutional:      Appearance: Normal appearance.  HENT:     Head: Normocephalic and atraumatic.     Nose: Nose normal. No congestion or rhinorrhea.     Mouth/Throat:     Mouth: Mucous membranes are dry.  Eyes:     Extraocular Movements: Extraocular movements intact.     Conjunctiva/sclera: Conjunctivae  normal.     Pupils: Pupils are equal, round, and reactive to light.  Neck:     Musculoskeletal: Normal range of motion and neck supple.  Cardiovascular:     Rate and Rhythm: Normal rate and regular rhythm.     Heart sounds: Normal heart sounds. No murmur.  Pulmonary:     Effort: Pulmonary effort is normal. No respiratory distress.     Breath sounds: Normal breath sounds. No stridor.  Abdominal:     General: Abdomen is flat. Bowel sounds are normal. There is no distension.     Palpations: Abdomen is soft.  Musculoskeletal: Normal range of motion.  Skin:    General: Skin is warm and dry.  Neurological:     General: No focal deficit present.     Mental Status: She is alert and oriented to person, place, and time.  Psychiatric:        Mood and Affect: Mood normal.        Behavior: Behavior normal.     Motor strength is 5/5 bilateral deltoid bicep tricep grip hip flexion extensor ankle dorsiflexion Sensation is intact to pinprick and light touch bilaterally C5 C6-C7-C8 through L3-L4 L5-S1 distribution Negative straight leg raise Gait is without evidence of toe drag or knee instability. Faber's on the right referring pain to the PSIS area Patient does have PSIS tenderness on the right side greater on the left side.   She has healed iliac harvest sites    Assessment & Plan:  1.  Lumbar postlaminectomy syndrome with chronic axial pain she does not have any significant radicular component at the current time. She has had inconsistent results with both caudal epidural injections as well as sacroiliac injections.  Her exam does appear to be most consistent with a sacroiliac pain on the right side.  We will set her up for L5 dorsal ramus injection S1-S2-S3 lateral branch blocks under fluoroscopic guidance.  We discussed potential role for radiofrequency neurotomy if a short-term relief is achieved with greater than 50%.  2.  Chronic narcotic analgesics she is on a low-dose 5 mg per night of hydrocodone.  If UDS is appropriate we can assume this prescription, may consider codeine given low-dose of hydrocodone  Over half of the 25 min visit was spent counseling and coordinating care.  Records review was extensive

## 2018-07-10 ENCOUNTER — Telehealth: Payer: Self-pay | Admitting: Sports Medicine

## 2018-07-10 NOTE — Addendum Note (Signed)
Addended by: Alphia Kava D on: 07/10/2018 11:59 AM   Modules accepted: Orders

## 2018-07-10 NOTE — Telephone Encounter (Signed)
Faxed orders to [facility]. 

## 2018-07-10 NOTE — Telephone Encounter (Signed)
I informed pt the orders had been sent to [facility].

## 2018-07-10 NOTE — Telephone Encounter (Signed)
Patient needs to get a referral for MRI. Dr. Marylene Land asked patient to call

## 2018-07-14 ENCOUNTER — Telehealth: Payer: Self-pay | Admitting: Family Medicine

## 2018-07-14 LAB — TOXASSURE SELECT,+ANTIDEPR,UR

## 2018-07-14 NOTE — Telephone Encounter (Signed)
Copied from CRM [id number]. Topic: Quick Communication - Rx Refill/Question >> Jul 14, 2018  5:12 PM Jens Som A wrote: Medication: predniSONE (STERAPRED UNI-PAK 48 TAB) 5 MG (48) TBPK tablet [[id number]]  Has the patient contacted their pharmacy? Yes  (Agent: If no, request that the patient contact the pharmacy for the refill.) (Agent: If yes, when and what did the pharmacy advise?)  Preferred Pharmacy (with phone number or street [facility]): [facility] [address] [place], [place] [phone]  Agent: Please be advised that RX refills may take up to 3 business days. We ask that you follow-up with your pharmacy.

## 2018-07-17 NOTE — Telephone Encounter (Signed)
Message sent to Dr. Jordan for review and approval. 

## 2018-07-17 NOTE — Telephone Encounter (Signed)
This is not appropriate. Oral steroids are usually prescribed after evaluation for an acute condition that usually responds to steroids.  She may need to be evaluated.  Thanks, BJ

## 2018-07-18 ENCOUNTER — Other Ambulatory Visit: Payer: Self-pay | Admitting: Family Medicine

## 2018-07-18 MED ORDER — BENZONATATE 100 MG PO CAPS: 100.0000 mg | ORAL_CAPSULE | Freq: Two times a day (BID) | ORAL | 0 refills | Status: AC | PRN

## 2018-07-18 NOTE — Telephone Encounter (Signed)
I sent Rx for medication for cough as requested. Please explain Jessica Deleon that I usually don't prescribe meds without appropriate evaluation, so in the future please arrange appt if new problem presents. If cough gets worse or she develops new symptoms (dyspnea,wheezing,or anusual chest pain among some) she needs to seek medical attention.  Thanks, BJ

## 2018-07-18 NOTE — Telephone Encounter (Signed)
Spoke with patient and she stated that she asked for Tessalon, not Prednisone. Patient stated that she was out of town and would be back tomorrow and just wanted some for the current cough that she has. Patient offered a web visit, but declined stating that she would take the [facility] when she returned home. Nothing further needed at this time.

## 2018-07-18 NOTE — Telephone Encounter (Signed)
Patient informed. 

## 2018-07-20 ENCOUNTER — Telehealth: Payer: Self-pay | Admitting: *Deleted

## 2018-07-20 MED ORDER — HYDROCODONE-ACETAMINOPHEN 5-325 MG PO TABS: 1.0000 | ORAL_TABLET | Freq: Three times a day (TID) | ORAL | 0 refills | Status: DC | PRN

## 2018-07-20 NOTE — Telephone Encounter (Addendum)
When ordering a flag appeared for codeine allergy. Please advise.  ----- Message from Erick Colace, MD sent at 07/19/2018 12:13 PM EDT ----- May call in tylenol with codeine #3, 30tab 1 RF

## 2018-07-20 NOTE — Telephone Encounter (Signed)
Order placed for hydrocodone.

## 2018-07-28 ENCOUNTER — Ambulatory Visit: Payer: Medicare Other | Admitting: Family Medicine

## 2018-07-28 ENCOUNTER — Ambulatory Visit (INDEPENDENT_AMBULATORY_CARE_PROVIDER_SITE_OTHER): Payer: Medicare Other | Admitting: Family Medicine

## 2018-07-28 ENCOUNTER — Encounter: Payer: Self-pay | Admitting: Family Medicine

## 2018-07-28 ENCOUNTER — Ambulatory Visit
Admission: RE | Admit: 2018-07-28 | Discharge: 2018-07-28 | Disposition: A | Discharge status: Home / Self Care | Payer: Medicare Other | Source: Ambulatory Visit | Attending: Sports Medicine | Admitting: Sports Medicine

## 2018-07-28 ENCOUNTER — Other Ambulatory Visit: Payer: Self-pay

## 2018-07-28 VITALS — Resp 12

## 2018-07-28 DIAGNOSIS — Z8781 Personal history of (healed) traumatic fracture: Secondary | ICD-10-CM

## 2018-07-28 DIAGNOSIS — G894 Chronic pain syndrome: Secondary | ICD-10-CM

## 2018-07-28 DIAGNOSIS — F419 Anxiety disorder, unspecified: Secondary | ICD-10-CM

## 2018-07-28 DIAGNOSIS — F331 Major depressive disorder, recurrent, moderate: Secondary | ICD-10-CM | POA: Diagnosis not present

## 2018-07-28 DIAGNOSIS — G4700 Insomnia, unspecified: Secondary | ICD-10-CM | POA: Diagnosis not present

## 2018-07-28 DIAGNOSIS — F339 Major depressive disorder, recurrent, unspecified: Secondary | ICD-10-CM | POA: Insufficient documentation

## 2018-07-28 DIAGNOSIS — M779 Enthesopathy, unspecified: Secondary | ICD-10-CM

## 2018-07-28 DIAGNOSIS — G8929 Other chronic pain: Secondary | ICD-10-CM

## 2018-07-28 DIAGNOSIS — M25571 Pain in right ankle and joints of right foot: Principal | ICD-10-CM

## 2018-07-28 NOTE — Assessment & Plan Note (Signed)
After discussion of some side effects, she agrees with resuming temazepam 15 mg at bedtime. Good sleep hygiene. Follow-up in 6 weeks.
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
## 2018-07-28 NOTE — Assessment & Plan Note (Signed)
She denies suicidal thoughts. Continue Cymbalta 90 mg daily. Strongly recommend establishing with psychotherapist. Follow-up in 6 weeks, before if needed.

## 2018-07-31 ENCOUNTER — Telehealth: Payer: Self-pay | Admitting: *Deleted

## 2018-07-31 NOTE — Telephone Encounter (Signed)
-----   Message from Asencion Islam, [place] sent at 07/29/2018  9:28 AM EDT ----- Please let patient know that MRI showed arthritis of her ankle that is severe. If she continues to have pain in her ankle it may be a good idea for her to see an orthopedic doctor to discuss ankle replacement surgery vs scope; None of our doctors do ankle replacement surgery so she would have to see ortho for this. Thanks Dr. Kathie Rhodes

## 2018-07-31 NOTE — Telephone Encounter (Signed)
[facility] could be a possible place for her to go for a surgery consult for ankle replacement She will need to call 1st to see if any of their doctors do total ankle replacement surgery if not patient may have to go to [facility] -Dr. Marylene Land

## 2018-07-31 NOTE — Telephone Encounter (Signed)
I informed pt of Dr. Wynema Birch review of results and recommendations and that a report of the MRI could be given from our office, but if she needed a copy of the MRI disc she would need to contact the imaging center. Pt asked for Dr. Wynema Birch recommendation of an orthopedic surgery for ankle replacement.

## 2018-08-01 ENCOUNTER — Telehealth: Payer: Self-pay | Admitting: *Deleted

## 2018-08-01 NOTE — Telephone Encounter (Signed)
Oral swab drug screen was consistent for no prescribed controlled medications. °

## 2018-08-01 NOTE — Telephone Encounter (Signed)
Pt called and I informed I had left the [facility] and [facility] phone on her cellphone.

## 2018-08-01 NOTE — Telephone Encounter (Signed)
called to let pt know disk and report of xrays are ready pt stated she will be in to pick up

## 2018-08-01 NOTE — Telephone Encounter (Signed)
Left message informing pt of Dr. Wynema Birch recommendation of [facility] [phone], and [facility] [phone].

## 2018-08-08 ENCOUNTER — Ambulatory Visit: Payer: Medicare Other | Admitting: Physical Medicine & Rehabilitation

## 2018-08-12 ENCOUNTER — Other Ambulatory Visit: Payer: Self-pay | Admitting: Family Medicine

## 2018-08-12 DIAGNOSIS — I872 Venous insufficiency (chronic) (peripheral): Secondary | ICD-10-CM

## 2018-08-15 ENCOUNTER — Other Ambulatory Visit: Payer: Self-pay | Admitting: Family Medicine

## 2018-08-15 DIAGNOSIS — E039 Hypothyroidism, unspecified: Secondary | ICD-10-CM

## 2018-08-15 DIAGNOSIS — M797 Fibromyalgia: Secondary | ICD-10-CM

## 2018-08-17 ENCOUNTER — Ambulatory Visit: Payer: Medicare Other | Admitting: Physical Medicine & Rehabilitation

## 2018-08-21 ENCOUNTER — Ambulatory Visit: Payer: Medicare Other | Admitting: Family Medicine

## 2018-08-31 DIAGNOSIS — M19071 Primary osteoarthritis, right ankle and foot: Secondary | ICD-10-CM | POA: Diagnosis not present

## 2018-08-31 DIAGNOSIS — M9261 Juvenile osteochondrosis of tarsus, right ankle: Secondary | ICD-10-CM | POA: Diagnosis not present

## 2018-08-31 DIAGNOSIS — M2021 Hallux rigidus, right foot: Secondary | ICD-10-CM | POA: Diagnosis not present

## 2018-08-31 DIAGNOSIS — X58XXXA Exposure to other specified factors, initial encounter: Secondary | ICD-10-CM | POA: Diagnosis not present

## 2018-08-31 DIAGNOSIS — M25571 Pain in right ankle and joints of right foot: Secondary | ICD-10-CM | POA: Diagnosis not present

## 2018-08-31 DIAGNOSIS — M2042 Other hammer toe(s) (acquired), left foot: Secondary | ICD-10-CM | POA: Diagnosis not present

## 2018-08-31 DIAGNOSIS — M2041 Other hammer toe(s) (acquired), right foot: Secondary | ICD-10-CM | POA: Diagnosis not present

## 2018-08-31 DIAGNOSIS — S93144A Subluxation of metatarsophalangeal joint of right lesser toe(s), initial encounter: Secondary | ICD-10-CM | POA: Diagnosis not present

## 2018-08-31 DIAGNOSIS — S93145A Subluxation of metatarsophalangeal joint of left lesser toe(s), initial encounter: Secondary | ICD-10-CM | POA: Diagnosis not present

## 2018-09-18 ENCOUNTER — Telehealth: Payer: Self-pay | Admitting: Family Medicine

## 2018-09-18 NOTE — Telephone Encounter (Signed)
Message sent to Dr. Jordan for review and approval. 

## 2018-09-18 NOTE — Telephone Encounter (Signed)
Copied from [place] [id number]. Topic: Quick Communication - Rx Refill/Question >> Sep 18, 2018 10:14 AM Margot Ables wrote: Medication: tiZANidine (ZANAFLEX) 2 MG tablet and HYDROcodone-acetaminophen (NORCO/VICODIN) 5-325 MG tablet - pt has a few of each left  Has the patient contacted their pharmacy? No refills left - call [place] Preferred Pharmacy (with phone number or street name): [id number] [place], [place] [phone] (Phone) [phone] (Fax)

## 2018-09-19 ENCOUNTER — Other Ambulatory Visit: Payer: Self-pay

## 2018-09-19 ENCOUNTER — Encounter: Payer: Medicare Other | Attending: Physical Medicine & Rehabilitation | Admitting: Physical Medicine & Rehabilitation

## 2018-09-19 VITALS — BP 132/76 | HR 62 | Temp 99.3°F | Ht 63.0 in | Wt 215.6 lb

## 2018-09-19 DIAGNOSIS — M533 Sacrococcygeal disorders, not elsewhere classified: Secondary | ICD-10-CM

## 2018-09-19 DIAGNOSIS — G8929 Other chronic pain: Secondary | ICD-10-CM

## 2018-09-19 DIAGNOSIS — Z5181 Encounter for therapeutic drug level monitoring: Secondary | ICD-10-CM | POA: Diagnosis not present

## 2018-09-19 MED ORDER — HYDROCODONE-ACETAMINOPHEN 5-325 MG PO TABS: 1.0000 | ORAL_TABLET | Freq: Three times a day (TID) | ORAL | 0 refills | Status: DC | PRN

## 2018-09-19 NOTE — Progress Notes (Signed)
  PROCEDURE RECORD [facility]   Name: Jessica Deleon DOB:[birth year] MRN: [id number]  Date:09/19/2018  Physician: Alysia Penna, MD    Nurse/CMA: Betti Cruz  Allergies:  Allergies  Allergen Reactions  . Clindamycin/Lincomycin Other (See Comments)    Rash, nausea, vomiting  . Codeine Rash  . Demerol [Meperidine] Other (See Comments)    Ras, nausea, vomiting  . Gadolinium Derivatives Other (See Comments)    Contrast, nausea, vomiting, rash  . Penicillins   . Tape Rash    Consent Signed: Yes.    Is patient diabetic? No.  CBG today?   Pregnant: No. LMP: No LMP recorded. Patient has had a hysterectomy. (age 2-55)  Anticoagulants: no Anti-inflammatory: no Antibiotics: no  Procedure: Right L5 S1-3 Lateral Branch Block Position: Prone Start Time: 11:15am End Time: 11:26am Fluoro Time:47  RN/CMA Carmie Kanner, CMA    Time 10:20am 11:31am    BP 132/76 147/77    Pulse 62 66    Respirations 16 16    O2 Sat 94 91    S/S 6/6 6/6    Pain Level 8/10 6/10     D/C home with no one, patient A & O X 3, D/C instructions reviewed, and sits independently.

## 2018-09-19 NOTE — Progress Notes (Signed)
L5 dorsal ramus S1-S2-S3 lateral branch blocks under fluoroscopic guidance Right side  Informed consent was obtained after describing risks and benefits of the procedure with patient these include bleeding bruising and infection.  He elects to proceed and has given written consent.  Patient placed prone on fluoroscopy table Betadine prep sterile drape a 25-gauge 1.5 inch needle was used to anesthetize skin and subcu tissue with 1% lidocaine 1 cc into each of 4 sites.  Then a 22-gauge 3.5" needle was inserted under fluoroscopic guidance for starting the S1 SAP sacral ala junction.  Bone contact made.  Isovue 200 x 0.5 mL demonstrated no intravascular uptake then 0.5 mL of 2% lidocaine was injected.  Then the lateral aspect of the S1, S2, S3 foramen was targeted.  Bone contact made out, Isovue-200 times 0.5 mL demonstrated no nerve root or intravascular uptake within 0.5 mL of 2% lidocaine solution was injected after negative drawback for blood.  Patient tolerated procedure well.  Postinjection instructions given. 

## 2018-09-19 NOTE — Patient Instructions (Signed)
Nerve blocks to sacroiliac performed We are looking for at least 50% relief of Right buttocks and low back pain

## 2018-09-22 ENCOUNTER — Other Ambulatory Visit: Payer: Self-pay | Admitting: Family Medicine

## 2018-09-22 DIAGNOSIS — M25511 Pain in right shoulder: Secondary | ICD-10-CM

## 2018-09-22 MED ORDER — TIZANIDINE HCL 2 MG PO TABS: 2.0000 mg | ORAL_TABLET | Freq: Every evening | ORAL | 0 refills | Status: DC | PRN

## 2018-09-22 NOTE — Telephone Encounter (Signed)
She mentioned right shoulder pain during her mother's OV. In 05/2018 she was referred to pain clinic. You can provide phone # , so she can call their office.  Zanaflex Rx sent. Ortho referral placed.  Thanks, BJ

## 2018-09-25 ENCOUNTER — Telehealth: Payer: Self-pay | Admitting: Physical Medicine & Rehabilitation

## 2018-09-25 NOTE — Telephone Encounter (Signed)
Ok that is fine

## 2018-09-25 NOTE — Telephone Encounter (Signed)
Got off the phone with Mrs. Jessica Deleon and she stated that she wanted to cancel her appt and not reschedule due to pain and soreness from last procedure. Stated the she was going to find her a new provider

## 2018-09-26 ENCOUNTER — Other Ambulatory Visit: Payer: Self-pay

## 2018-09-26 ENCOUNTER — Encounter: Payer: Self-pay | Admitting: Family Medicine

## 2018-09-26 ENCOUNTER — Ambulatory Visit (INDEPENDENT_AMBULATORY_CARE_PROVIDER_SITE_OTHER): Payer: Medicare Other | Admitting: Family Medicine

## 2018-09-26 ENCOUNTER — Other Ambulatory Visit (INDEPENDENT_AMBULATORY_CARE_PROVIDER_SITE_OTHER): Payer: Medicare Other

## 2018-09-26 DIAGNOSIS — M545 Low back pain, unspecified: Secondary | ICD-10-CM

## 2018-09-26 DIAGNOSIS — G8929 Other chronic pain: Secondary | ICD-10-CM | POA: Diagnosis not present

## 2018-09-26 DIAGNOSIS — G894 Chronic pain syndrome: Secondary | ICD-10-CM

## 2018-09-26 LAB — URINALYSIS, ROUTINE W REFLEX MICROSCOPIC
Bilirubin Urine: NEGATIVE
Hgb urine dipstick: NEGATIVE
Ketones, ur: NEGATIVE
Leukocytes,Ua: NEGATIVE
Nitrite: NEGATIVE
RBC / HPF: NONE SEEN (ref 0–?)
Specific Gravity, Urine: 1.025 (ref 1.000–1.030)
Total Protein, Urine: NEGATIVE
Urine Glucose: NEGATIVE
Urobilinogen, UA: 0.2 (ref 0.0–1.0)
pH: 5.5 (ref 5.0–8.0)

## 2018-09-26 NOTE — Telephone Encounter (Signed)
Patient was seen through virtual visit today. Nothing further needed.

## 2018-09-26 NOTE — Progress Notes (Signed)
Virtual Visit via Telephone Note  I connected with Jessica Deleon on 09/26/18 at 10:00 AM EDT by telephone and verified that I am speaking with the correct person using two identifiers.   I discussed the limitations, risks, security and privacy concerns of performing an evaluation and management service by telephone and the availability of in person appointments. I also discussed with the patient that there may be a patient responsible charge related to this service. The patient expressed understanding and agreed to proceed.  Location patient: home Location provider: work office Participants present for the call: patient, provider Patient did not have a visit in the prior 7 days to address this/these issue(s).   History of Present Illness:  Jessica Deleon is a 68 years old female with history of fibromyalgia, OA, chronic pain, anxiety, depression, and migraine among some, who thinks she may have a UTI.   Denies dysuria,increased urinary frequency, gross hematuria,or decreased urine output. She has history of chronic pain, pain and arthralgias.  According to patient, pain was aggravated by "shot" she received during her last visit with Dr. Gena Fray, 09/19/18. States that it hurts "very bad", 10/10constant, exacerbated by movement. Intermittent bilateral LE numbness,exacerbated by prolonged sitting on toilet and stable for years.  Denies saddler anesthesia or changes in urine/bowel continence. No alleviating factors identified.  She tells me that she "fired" Dr Elnita Maxwell, she does not want to go back.  She denies fever,chills,unusual fatigue, abdominal pain,nausea,vomiting,vaginal discharge or bleeding.  Observations/Objective: Patient sounds cheerful and well on the phone. I do not appreciate any SOB. Speech and thought processing are grossly intact. Anxious. Patient reported vitals:N/A  Assessment and Plan:  1. Chronic right-sided low back pain, unspecified whether sciatica  present This back pain is a chronic problem, I do not think it is caused by UTI,no urinary symptoms. She would like UA done,lab appt will be arranged.  - Urinalysis, Routine w reflex microscopic; Future  2. Chronic pain disorder Educated about current guidelines in regard to pain management and chronic opioid use. Explained that it may be difficult to find here in town,she is contemplating going back to [place] periodically for pain management with former provider.   Follow Up Instructions:  She is coming today to collect urine sample.  I did not refer this patient for an OV in the next 24 hours for this/these issue(s).  I discussed the assessment and treatment plan with the patient. She was provided an opportunity to ask questions and all were answered. The patient agreed with the plan and demonstrated an understanding of the instructions.   The patient was advised to call back or seek an in-person evaluation if the symptoms worsen or if the condition fails to improve as anticipated.  I provided 12 minutes of non-face-to-face time during this encounter.   Dezmon Conover [place], MD

## 2018-10-20 ENCOUNTER — Encounter: Payer: Self-pay | Admitting: Family Medicine

## 2018-10-20 ENCOUNTER — Other Ambulatory Visit: Payer: Self-pay

## 2018-10-20 ENCOUNTER — Ambulatory Visit (INDEPENDENT_AMBULATORY_CARE_PROVIDER_SITE_OTHER): Payer: Medicare Other | Admitting: Family Medicine

## 2018-10-20 VITALS — BP 138/78 | HR 86 | Temp 98.3°F | Resp 12 | Ht 63.0 in | Wt 214.4 lb

## 2018-10-20 DIAGNOSIS — E039 Hypothyroidism, unspecified: Secondary | ICD-10-CM | POA: Diagnosis not present

## 2018-10-20 DIAGNOSIS — G4700 Insomnia, unspecified: Secondary | ICD-10-CM

## 2018-10-20 DIAGNOSIS — R5382 Chronic fatigue, unspecified: Secondary | ICD-10-CM | POA: Insufficient documentation

## 2018-10-20 DIAGNOSIS — G4719 Other hypersomnia: Secondary | ICD-10-CM | POA: Diagnosis not present

## 2018-10-20 DIAGNOSIS — M797 Fibromyalgia: Secondary | ICD-10-CM | POA: Diagnosis not present

## 2018-10-20 DIAGNOSIS — D510 Vitamin B12 deficiency anemia due to intrinsic factor deficiency: Secondary | ICD-10-CM

## 2018-10-20 LAB — VITAMIN B12: Vitamin B-12: 216 pg/mL (ref 211–911)

## 2018-10-20 LAB — TSH: TSH: 1.32 u[IU]/mL (ref 0.35–4.50)

## 2018-10-20 NOTE — Patient Instructions (Signed)
A few things to remember from today's visit:   Pernicious anemia - Plan: Vitamin B12  Sleep disorder, unspecified  Fibromyalgia syndrome  Insomnia, unspecified type  Hypothyroidism (acquired) - Plan: TSH  Daytime hypersomnolence - Plan: Ambulatory referral to Pulmonology   Hypersomnia Hypersomnia is a condition in which a person feels very tired during the day even though he or she gets plenty of sleep at night. A person with this condition may take naps during the day and may find it very difficult to wake up from sleep. Hypersomnia may affect a person's ability to think, concentrate, drive, or remember things. What are the causes? The cause of this condition may not be known. Possible causes include:  Certain medicines.  Sleep disorders, such as narcolepsy and sleep apnea.  Injury to the head, brain, or spinal cord.  Drug or alcohol use.  Gastroesophageal reflux disease (GERD).  Tumors.  Certain medical conditions, such as depression, diabetes, or an underactive thyroid gland (hypothyroidism). What are the signs or symptoms? The main symptoms of hypersomnia include:  Feeling very tired throughout the day, regardless of how much sleep you got the night before.  Having trouble waking up. Others may find it difficult to wake you up when you are sleeping.  Sleeping for longer and longer periods at a time.  Taking naps throughout the day. Other symptoms may include:  Feeling restless, anxious, or annoyed.  Lacking energy.  Having trouble with: ? Remembering. ? Speaking. ? Thinking.  Loss of appetite.  Seeing, hearing, tasting, smelling, or feeling things that are not real (hallucinations). How is this diagnosed? This condition may be diagnosed based on:  Your symptoms and medical history.  Your sleeping habits. Your health care provider may ask you to write down your sleeping habits in a daily sleep log, along with any symptoms you have.  A series of  tests that are done while you sleep (sleep study or polysomnogram).  A test that measures how quickly you can fall asleep during the day (daytime nap study or multiple sleep latency test). How is this treated? Treatment can help you manage your condition. Treatment may include:  Following a regular sleep routine.  Lifestyle changes, such as changing your eating habits, getting regular exercise, and avoiding alcohol or caffeinated beverages.  Taking medicines to make you more alert (stimulants) during the day.  Treating any underlying medical causes of hypersomnia. Follow these instructions at home: Sleep routine   Schedule the same bedtime and wake-up time each day.  Practice a relaxing bedtime routine. This may include reading, meditation, deep breathing, or taking a warm bath before going to sleep.  Get regular exercise each day. Avoid strenuous exercise in the evening hours.  Keep your sleep environment at a cooler temperature, darkened, and quiet.  Sleep with pillows and a mattress that are comfortable and supportive.  Schedule short 20-minute naps for when you feel sleepiest during the day.  Talk with your employer or teachers about your hypersomnia. If possible, adjust your schedule so that: ? You have a regular daytime work schedule. ? You can take a scheduled nap during the day. ? You do not have to work or be active at night.  Do not eat a heavy meal for a few hours before bedtime. Eat your meals at about the same times every day.  Avoid drinking alcohol or caffeinated beverages. Safety   Do not drive or use heavy machinery if you are sleepy. Ask your health care provider if it  is safe for you to drive.  Wear a life jacket when swimming or spending time near water. General instructions  Take supplements and over-the-counter and prescription medicines only as told by your health care provider.  Keep a sleep log that will help your doctor manage your condition.  This may include information about: ? What time you go to bed each night. ? How often you wake up at night. ? How many hours you sleep at night. ? How often and for how long you nap during the day. ? Any observations from others, such as leg movements during sleep, sleep walking, or snoring.  Keep all follow-up visits as told by your health care provider. This is important. Contact a health care provider if:  You have new symptoms.  Your symptoms get worse. Get help right away if:  You have serious thoughts about hurting yourself or someone else. If you ever feel like you may hurt yourself or others, or have thoughts about taking your own life, get help right away. You can go to your nearest emergency department or call:  Your local emergency services (911 in the [place]  A suicide crisis helpline, such as the [facility] at 228-762-6845. This is open 24 hours a day. Summary  Hypersomnia refers to a condition in which you feel very tired during the day even though you get plenty of sleep at night.  A person with this condition may take naps during the day and may find it very difficult to wake up from sleep.  Hypersomnia may affect a person's ability to think, concentrate, drive, or remember things.  Treatment, such as following a regular sleep routine and making some lifestyle changes, can help you manage your condition. This information is not intended to replace advice given to you by your health care provider. Make sure you discuss any questions you have with your health care provider. Document Released: 03/19/2002 Document Revised: 03/31/2017 Document Reviewed: 03/31/2017 Elsevier Patient Education  [place].  Please be sure medication list is accurate. If a new problem present, please set up appointment sooner than planned today.

## 2018-10-20 NOTE — Progress Notes (Signed)
HPI:  Chief Complaint  Patient presents with  . Evaluation for narcolepsy    [name] is a 68 y.o. female, who is here today complaining of fatigue/sleepiness, problem is chronic but worse for the past couple months.  She thinks she has narcolepsy.  Hx of OSA,surgical treatment 10 years ago.She is positive problem is not related to OSA. Falling asleep during the day. Long driving is difficult because she feels like she is falling asleep.  Denies abnormal wt loss, visual changes, CP,dyspnea,cough, abdominal pain,changes in bowel habits.  Pernicious anemia,she is not taking B12 supplementation. States that she started taking iron supplementation again because in the past when symptom have been worse, her iron has been low.  Lab Results  Component Value Date   WBC 5.4 05/19/2018   HGB 13.2 05/19/2018   HCT 40.5 05/19/2018   MCV 85.6 05/19/2018   PLT 204 05/19/2018     "Not sleeping" Difficulty falling asleep and when she finally does,her mother wakes her up. 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 Problem exacerbated by stress. She has been traveling back and fourth from [place].  She was taking Temazepam in the past but has not taken it for months,she doe snot want to resume med. Denies morning headaches. Hx of migraine headache,she is on Topamax.  "tired all the time." Hx of depression and fibromyalgia, she takes Cymbalta 90 mg daily. In general she feels like she is dealing with stressful situations well.  Chronic pain disorder and OA, followed with pain management and ortho. Shoulders,back,and feet pain mainly. No joint edema or erythema.  Hypothyroidism,reporting last TSH less than a year ago and Levothyroxine dose was not adjusted. Negative for cold/heat intolerance,diarreha/constipation.   Review of Systems  Constitutional: Negative for activity change, appetite change, chills and fever.  HENT: Negative for mouth sores, nosebleeds, sore  throat and trouble swallowing.   Gastrointestinal: Negative for abdominal pain and diarrhea.  Endocrine: Negative for polydipsia, polyphagia and polyuria.  Genitourinary: Negative for decreased urine volume, dysuria and hematuria.  Musculoskeletal: Positive for arthralgias, back pain and myalgias. Negative for gait problem.  Skin: Negative for pallor and rash.  Allergic/Immunologic: Positive for environmental allergies.  Neurological: Negative for facial asymmetry and speech difficulty.  Psychiatric/Behavioral: Negative for confusion and hallucinations. The patient is nervous/anxious.   Rest see pertinent positives and negatives per HPI.   Current Outpatient Medications on File Prior to Visit  Medication Sig Dispense Refill  . benzonatate (TESSALON) 100 MG capsule TAKE 1 CAPSULE (100 MG TOTAL) BY MOUTH 2 (TWO) TIMES DAILY AS NEEDED FOR UP TO 5 DAYS.    Marland Kitchen. BUTALBITAL-APAP-CAFFEINE PO Take by mouth. Take 1 tablet by mouth every 4 hours as needed    . DULoxetine (CYMBALTA) 30 MG capsule Take 1 capsule (30 mg total) by mouth daily. 90 capsule 0  . DULoxetine (CYMBALTA) 60 MG capsule TAKE 1 CAPSULE (60 MG TOTAL) BY MOUTH DAILY. 1 CAPSULE BY MOUTH EVERY DAY ALONG WITH 30 MG CAPSULE 90 capsule 0  . ferrous sulfate 325 (65 FE) MG tablet Take 1 tablet (325 mg total) by mouth once a week. 15 tablet 3  . furosemide (LASIX) 20 MG tablet TAKE 1 TABLET BY MOUTH TWICE DAILY FOR 5 DAYS, THEN 1 TABLET DAILY AS NEEDD 30 tablet 3  . HYDROcodone-acetaminophen (NORCO/VICODIN) 5-325 MG tablet Take 1 tablet by mouth every 8 (eight) hours as needed for moderate pain. 30 tablet 0  . levothyroxine (SYNTHROID) 112 MCG tablet TAKE  1 TABLET (112 MCG TOTAL) BY MOUTH DAILY BEFORE BREAKFAST. 90 tablet 0  . ondansetron (ZOFRAN) 8 MG tablet Take 8 mg by mouth. 1 tablet by mouth daily as needed    . pramipexole (MIRAPEX) 1.5 MG tablet Take 1.5 mg by mouth. Take 2 tablets by mouth daily    . tiZANidine (ZANAFLEX) 2 MG tablet  Take 1-2 tablets (2-4 mg total) by mouth at bedtime as needed for muscle spasms. 1 to 2 tablets by mouth at bedtime 90 tablet 0  . topiramate (TOPAMAX) 100 MG tablet Take 100 mg by mouth daily.    Marland Kitchen triamcinolone cream (KENALOG) 0.1 % Apply 1 application topically daily as needed. 45 g 0   No current facility-administered medications on file prior to visit.      Past Medical History:  Diagnosis Date  . Allergy   . Arthritis   . Diverticulitis   . GERD (gastroesophageal reflux disease)   . Heart murmur   . History of chicken pox   . Migraines   . Seizures ([place])   . Thyroid disease   . Urine incontinence    Allergies  Allergen Reactions  . Clindamycin/Lincomycin Other (See Comments)    Rash, nausea, vomiting  . Codeine Rash  . Demerol [Meperidine] Other (See Comments)    Ras, nausea, vomiting  . Gadolinium Derivatives Other (See Comments)    Contrast, nausea, vomiting, rash  . Penicillins   . Tape Rash    Social History   Socioeconomic History  . Marital status: Widowed    Spouse name: Not on file  . Number of children: 4  . Years of education: Not on file  . Highest education level: Not on file  Occupational History  . Not on file  Social Needs  . Financial resource strain: Not on file  . Food insecurity    Worry: Not on file    Inability: Not on file  . Transportation needs    Medical: Not on file    Non-medical: Not on file  Tobacco Use  . Smoking status: Never Smoker  . Smokeless tobacco: Never Used  Substance and Sexual Activity  . Alcohol use: Never    Frequency: Never  . Drug use: Never  . Sexual activity: Not Currently  Lifestyle  . Physical activity    Days per week: Not on file    Minutes per session: Not on file  . Stress: Not on file  Relationships  . Social Herbalist on phone: Not on file    Gets together: Not on file    Attends religious service: Not on file    Active member of club or organization: Not on file    Attends  meetings of clubs or organizations: Not on file    Relationship status: Not on file  Other Topics Concern  . Not on file  Social History Narrative  . Not on file    Vitals:   10/20/18 1118  BP: 138/78  Pulse: 86  Resp: 12  Temp: 98.3 F (36.8 C)  SpO2: 94%   Body mass index is 37.97 kg/m.  Physical Exam  Nursing note and vitals reviewed. Constitutional: She is oriented to person, place, and time. She appears well-developed. No distress.  HENT:  Head: Normocephalic and atraumatic.  Mouth/Throat: Oropharynx is clear and moist and mucous membranes are normal.  Eyes: Pupils are equal, round, and reactive to light. Conjunctivae are normal.  Neck: No tracheal deviation present.  Cardiovascular: Normal  rate and regular rhythm.  No murmur heard. Pulses:      Dorsalis pedis pulses are 2+ on the right side and 2+ on the left side.  Respiratory: Effort normal and breath sounds normal. No respiratory distress.  GI: Soft. She exhibits no mass. There is no hepatomegaly. There is no abdominal tenderness.  Musculoskeletal:        General: No edema.     Thoracic back: She exhibits tenderness.     Lumbar back: She exhibits tenderness.     Comments: + Tender trigger points upper and lower back,chest wall,and extremities.  Lymphadenopathy:    She has no cervical adenopathy.  Neurological: She is alert and oriented to person, place, and time. She has normal strength. No cranial nerve deficit.  Antalgic gait.  Skin: Skin is warm. No rash noted. No erythema.  Psychiatric: Her mood appears anxious.  Well groomed, good eye contact.    ASSESSMENT AND PLAN:  Ms. Eber JonesCarolyn was seen today for evaluation for narcolepsy.  Diagnoses and all orders for this visit:  Lab Results  Component Value Date   VITAMINB12 216 10/20/2018   Lab Results  Component Value Date   TSH 1.32 10/20/2018   Pernicious anemia Further recommendations will be given according to labs results.  -     Vitamin B12   Fibromyalgia syndrome Stable. No changes in Cymbalta dose. Consider following again with pain management.  Insomnia, unspecified type Good sleep hygiene. She is not interested in pharmacologic treatment for now. I asked her mother,who is here with her to try not to interfere with sleep unless it is an emergency.  Hypothyroidism (acquired) No changes in current management, will follow labs done today and will give further recommendations accordingly.  -     TSH  Daytime hypersomnolence A few of her chronic medical problems and meds could be contributing to fatigue. She has Hx of OSA, last sleep study 10 years ago. She does not think OSA is causing problem but agrees with evaluation and discussion about sleep study.  -     Ambulatory referral to Pulmonology    No follow-ups on file.     Hanish Laraia G. [place], MD  [facility]. [place] office.

## 2018-10-23 ENCOUNTER — Telehealth: Payer: Self-pay | Admitting: Family Medicine

## 2018-10-23 ENCOUNTER — Ambulatory Visit: Payer: Medicare Other | Admitting: Physical Medicine & Rehabilitation

## 2018-10-23 NOTE — Telephone Encounter (Signed)
Patient called to let PCP know that her shoulder is hurting and is swollen.  Patient has an appt with an ortho MD on 7/16.  Call back [phone]

## 2018-10-24 NOTE — Telephone Encounter (Signed)
FYI sent to Dr. Jordan for review. 

## 2018-10-26 ENCOUNTER — Ambulatory Visit (INDEPENDENT_AMBULATORY_CARE_PROVIDER_SITE_OTHER): Payer: Medicare Other | Admitting: Orthopaedic Surgery

## 2018-10-26 ENCOUNTER — Ambulatory Visit (INDEPENDENT_AMBULATORY_CARE_PROVIDER_SITE_OTHER): Payer: Medicare Other

## 2018-10-26 ENCOUNTER — Encounter: Payer: Self-pay | Admitting: Orthopaedic Surgery

## 2018-10-26 ENCOUNTER — Other Ambulatory Visit: Payer: Self-pay

## 2018-10-26 VITALS — BP 150/71 | HR 65 | Ht 63.0 in | Wt 212.0 lb

## 2018-10-26 DIAGNOSIS — G8929 Other chronic pain: Secondary | ICD-10-CM

## 2018-10-26 DIAGNOSIS — M12812 Other specific arthropathies, not elsewhere classified, left shoulder: Secondary | ICD-10-CM

## 2018-10-26 DIAGNOSIS — M19012 Primary osteoarthritis, left shoulder: Secondary | ICD-10-CM | POA: Diagnosis not present

## 2018-10-26 DIAGNOSIS — M7581 Other shoulder lesions, right shoulder: Secondary | ICD-10-CM | POA: Diagnosis not present

## 2018-10-26 DIAGNOSIS — M25511 Pain in right shoulder: Secondary | ICD-10-CM

## 2018-10-26 MED ORDER — METHYLPREDNISOLONE ACETATE 40 MG/ML IJ SUSP
80.0000 mg | INTRAMUSCULAR | Status: AC | PRN
  Administered 2018-10-26: 80 mg via INTRA_ARTICULAR

## 2018-10-26 MED ORDER — BUPIVACAINE HCL 0.25 % IJ SOLN
2.0000 mL | INTRAMUSCULAR | Status: AC | PRN
  Administered 2018-10-26: 2 mL via INTRA_ARTICULAR

## 2018-10-26 MED ORDER — LIDOCAINE HCL 1 % IJ SOLN
2.0000 mL | INTRAMUSCULAR | Status: AC | PRN
  Administered 2018-10-26: 2 mL

## 2018-10-26 NOTE — Progress Notes (Signed)
Office Visit Note   Patient: Jessica Deleon           Date of Birth: [birth year]           MRN: [id number] Visit Date: 10/26/2018              Requested by: Swazil[place]Jordan, Betty G, MD [address] [place],  [place] [id number] PCP: [place]il[place]Jordan, Betty G, MD   Assessment & Plan: Visit Diagnoses:  1. Chronic right shoulder pain   2. Rotator cuff tendonitis, right   3. Rotator cuff arthropathy of left shoulder   4. Arthritis of left acromioclavicular joint     Plan:  #1: Subacromial injection to the right shoulder. #2: Discussed allergies with her she states that she is allergic to the iodinated contrast but not to Betadine topically. #3: If ineffective then probably will need to consider MRI scanning.  Follow-Up Instructions: Return if symptoms worsen or fail to improve.   Orders:  Orders Placed This Encounter  Procedures  . XR Shoulder Right   No orders of the defined types were placed in this encounter.     Procedures: Large Joint Inj: R subacromial bursa on 10/26/2018 11:31 AM Indications: pain and diagnostic evaluation Details: 25 G 1.5 in needle, anterolateral approach  Arthrogram: No  Medications: 80 mg methylPREDNISolone acetate 40 MG/ML; 2 mL lidocaine 1 %; 2 mL bupivacaine 0.25 %  Had decreased pain post injection with more fluid motion Procedure, treatment alternatives, risks and benefits explained, specific risks discussed. Consent was given by the patient. Immediately prior to procedure a time out was called to verify the correct patient, procedure, equipment, support staff and site/side marked as required. Patient was prepped and draped in the usual sterile fashion.       Clinical Data: No additional findings.   Subjective: Chief Complaint  Patient presents with  . Right Shoulder - Pain  Patient presents today with right shoulder pain X7 months. Patient moved her mother from [place] to [place]. She said that she was lifting and moving things  that she should not have been. She has been having pain in her right shoulder and down her arm since. She has a history of two different rotator cuff surgeries. She states that she has good range of motion, but has some tingling and numbness. She has also notices that her arm is swollen and keeps her awake at night.   HPI  History as above. She has had several surgeries for rotator cuff tears on the right shoulder.  She has been moving her mother from [place] to the [place] area.  She states that her pain is in the shoulder and right and refers down to the elbow but not past the elbow.  She does have some tingling and numbness in the upper arm.  She states that she has some swelling in the upper arm.   Review of Systems  Constitutional: Positive for fatigue.  HENT: Negative for ear pain.   Eyes: Negative for pain.  Respiratory: Negative for shortness of breath.   Cardiovascular: Positive for leg swelling.  Gastrointestinal: Negative for constipation, diarrhea and nausea.  Endocrine: Negative for cold intolerance and heat intolerance.  Genitourinary: Negative for difficulty urinating.  Musculoskeletal: Positive for joint swelling.  Skin: Negative for rash.  Allergic/Immunologic: Negative for food allergies.  Neurological: Negative for weakness.  Hematological: Does not bruise/bleed easily.  Psychiatric/Behavioral: Positive for sleep disturbance.     Objective: Vital Signs: BP (!) 150/71  Pulse 65   Ht 5\' 3"  (1.6 m)   Wt 212 lb (96.2 kg)   BMI 37.55 kg/m   Physical Exam Constitutional:      Appearance: Normal appearance. She is well-developed.  Eyes:     Pupils: Pupils are equal, round, and reactive to light.  Pulmonary:     Effort: Pulmonary effort is normal.  Skin:    General: Skin is warm and dry.  Neurological:     Mental Status: She is alert and oriented to person, place, and time.  Psychiatric:        Behavior: Behavior normal.     Ortho Exam  Exam  today of the right shoulder reveals a previous surgical incision anteriorly.  She does have range of motion passively to 90 degrees of abduction.  With the arm at 90 degrees of abduction she has about 30 degrees of external rotation 20 to 30 degrees of internal rotation.  She does have a positive empty can test.  She does have fairly good strength in abduction and.  Little bit weaker and forward flexion.  Some tenderness over the [facility] joint.  Good grip strength.  Deep tendon reflex on the right is 2+ bilateral and the biceps and triceps with brachial radialis.  Good strength with testing.  She does not particularly have pain referred down the right arm with right and left rotation or backward extension forward flexion of the cervical spine.  Specialty Comments:  No specialty comments available.  Imaging: Xr Shoulder Right  Result Date: 10/26/2018 2 view x-ray of the right shoulder reveals high riding humeral head with near impingement of the humeral head to the acromion.  She is maintaining good joint space.  She has a type I-II acromion.  Some calcification about the head.  Does also have some AC DJD.Marland Kitchen  She has not had a distal clavicle resection in the past.    PMFS History: Current Outpatient Medications  Medication Sig Dispense Refill  . benzonatate (TESSALON) 100 MG capsule TAKE 1 CAPSULE (100 MG TOTAL) BY MOUTH 2 (TWO) TIMES DAILY AS NEEDED FOR UP TO 5 DAYS.    Marland Kitchen BUTALBITAL-APAP-CAFFEINE PO Take by mouth. Take 1 tablet by mouth every 4 hours as needed    . DULoxetine (CYMBALTA) 30 MG capsule Take 1 capsule (30 mg total) by mouth daily. 90 capsule 0  . DULoxetine (CYMBALTA) 60 MG capsule TAKE 1 CAPSULE (60 MG TOTAL) BY MOUTH DAILY. 1 CAPSULE BY MOUTH EVERY DAY ALONG WITH 30 MG CAPSULE 90 capsule 0  . ferrous sulfate 325 (65 FE) MG tablet Take 1 tablet (325 mg total) by mouth once a week. 15 tablet 3  . furosemide (LASIX) 20 MG tablet TAKE 1 TABLET BY MOUTH TWICE DAILY FOR 5 DAYS, THEN 1 TABLET  DAILY AS NEEDD 30 tablet 3  . HYDROcodone-acetaminophen (NORCO/VICODIN) 5-325 MG tablet Take 1 tablet by mouth every 8 (eight) hours as needed for moderate pain. 30 tablet 0  . levothyroxine (SYNTHROID) 112 MCG tablet TAKE 1 TABLET (112 MCG TOTAL) BY MOUTH DAILY BEFORE BREAKFAST. 90 tablet 0  . ondansetron (ZOFRAN) 8 MG tablet Take 8 mg by mouth. 1 tablet by mouth daily as needed    . pramipexole (MIRAPEX) 1.5 MG tablet Take 1.5 mg by mouth. Take 2 tablets by mouth daily    . tiZANidine (ZANAFLEX) 2 MG tablet Take 1-2 tablets (2-4 mg total) by mouth at bedtime as needed for muscle spasms. 1 to 2 tablets by mouth at bedtime  90 tablet 0  . topiramate (TOPAMAX) 100 MG tablet Take 100 mg by mouth daily.    Marland Kitchen. triamcinolone cream (KENALOG) 0.1 % Apply 1 application topically daily as needed. 45 g 0   No current facility-administered medications for this visit.     Patient Active Problem List   Diagnosis Date Noted  . Rotator cuff tendonitis, right 10/26/2018  . Rotator cuff arthropathy of left shoulder 10/26/2018  . Arthritis of left acromioclavicular joint 10/26/2018  . Chronic fatigue 10/20/2018  . Hypothyroidism (acquired) 10/20/2018  . Chronic right sacroiliac pain 09/19/2018  . Insomnia 07/28/2018  . Depression, major, recurrent (HCC) 07/28/2018  . Anxiety disorder 07/28/2018  . Pernicious anemia 05/22/2018  . Vitamin D deficiency, unspecified 05/22/2018  . Venous stasis dermatitis of both lower extremities 05/17/2018  . Erythema of lower extremity 05/17/2018  . Fibromyalgia syndrome 05/17/2018  . Chronic pain disorder 05/17/2018   Past Medical History:  Diagnosis Date  . Allergy   . Arthritis   . Diverticulitis   . GERD (gastroesophageal reflux disease)   . Heart murmur   . History of chicken pox   . Migraines   . Seizures (HCC)   . Thyroid disease   . Urine incontinence     Family History  Problem Relation Age of Onset  . Arthritis Mother   . Cancer Mother   .  Hypertension Mother   . Stroke Mother   . Arthritis Father   . COPD Father   . Diabetes Father   . Early death Father   . Hearing loss Father   . Stroke Father   . Arthritis Daughter   . Cancer Daughter   . Asthma Son     Past Surgical History:  Procedure Laterality Date  . ABDOMINAL HYSTERECTOMY  1983  . APPENDECTOMY  1960  . BONE TUMOR EXCISION  1977  . CHOLECYSTECTOMY  2008  . REPLACEMENT TOTAL KNEE BILATERAL    . TONSILLECTOMY AND ADENOIDECTOMY  1954   Social History   Occupational History  . Not on file  Tobacco Use  . Smoking status: Never Smoker  . Smokeless tobacco: Never Used  Substance and Sexual Activity  . Alcohol use: Never    Frequency: Never  . Drug use: Never  . Sexual activity: Not Currently

## 2018-10-31 ENCOUNTER — Other Ambulatory Visit: Payer: Self-pay | Admitting: Family Medicine

## 2018-11-08 ENCOUNTER — Telehealth: Payer: Self-pay | Admitting: Family Medicine

## 2018-11-08 ENCOUNTER — Other Ambulatory Visit: Payer: Self-pay

## 2018-11-08 ENCOUNTER — Ambulatory Visit (INDEPENDENT_AMBULATORY_CARE_PROVIDER_SITE_OTHER): Payer: Medicare Other

## 2018-11-08 DIAGNOSIS — E538 Deficiency of other specified B group vitamins: Secondary | ICD-10-CM

## 2018-11-08 MED ORDER — CYANOCOBALAMIN 1000 MCG/ML IJ SOLN
1000.0000 ug | Freq: Once | INTRAMUSCULAR | Status: AC
  Administered 2018-11-08: 1000 ug via INTRAMUSCULAR

## 2018-11-08 NOTE — Telephone Encounter (Signed)
Pt was requesting a refill on her Proair inhaler   Pharm:  [facility]

## 2018-11-08 NOTE — Progress Notes (Signed)
Per orders of Dr. Jordan, injection of B12 given by Lekeith Wulf. Patient tolerated injection well.  

## 2018-11-09 NOTE — Telephone Encounter (Signed)
Message routed to PCP.

## 2018-11-09 NOTE — Telephone Encounter (Signed)
Patient notified that there are no inhalers on her med list. Pt stated she has bronchitis and other pulmonary issues and has been using 2 inhalers. Pt was notified that Dr. Martinique is not in until tomorrow and I will forward the message to her. Pt verbalized understanding.

## 2018-11-10 ENCOUNTER — Other Ambulatory Visit: Payer: Self-pay | Admitting: Family Medicine

## 2018-11-10 DIAGNOSIS — E039 Hypothyroidism, unspecified: Secondary | ICD-10-CM

## 2018-11-10 DIAGNOSIS — M797 Fibromyalgia: Secondary | ICD-10-CM

## 2018-11-14 ENCOUNTER — Other Ambulatory Visit: Payer: Self-pay | Admitting: Family Medicine

## 2018-11-14 MED ORDER — ALBUTEROL SULFATE HFA 108 (90 BASE) MCG/ACT IN AERS: 2.0000 | INHALATION_SPRAY | Freq: Four times a day (QID) | RESPIRATORY_TRACT | 2 refills | Status: AC | PRN

## 2018-11-14 NOTE — Progress Notes (Signed)
pro

## 2018-11-14 NOTE — Telephone Encounter (Signed)
Albuterol inhaler sent to her pharmacy as requested. Thanks, BJ

## 2018-11-20 ENCOUNTER — Telehealth: Payer: Self-pay | Admitting: Family Medicine

## 2018-11-20 NOTE — Telephone Encounter (Signed)
Medication Refill - Medication: HYDROcodone-acetaminophen (NORCO/VICODIN) 5-325 MG tablet   Has the patient contacted their pharmacy?Yes   (Agent: If no, request that the patient contact the pharmacy for the refill.) (Agent: If yes, when and what did the pharmacy advise?)Contact PCP  Preferred Pharmacy (with phone number or street name):  [id number] [address] [place], [place] [phone] (Phone) [phone] (Fax)     Agent: Please be advised that RX refills may take up to 3 business days. We ask that you follow-up with your pharmacy.

## 2018-11-20 NOTE — Telephone Encounter (Signed)
See request °

## 2018-11-20 NOTE — Telephone Encounter (Signed)
Message sent to Dr. Jordan for review and approval. 

## 2018-11-21 ENCOUNTER — Other Ambulatory Visit: Payer: Self-pay

## 2018-11-21 ENCOUNTER — Encounter: Payer: Self-pay | Admitting: Family Medicine

## 2018-11-21 NOTE — Telephone Encounter (Signed)
I am sorry but I am not managing her chronic pain. [She was following with pain management and she decided to leave practice. I explained it may be difficult to get establish with a new one.] She can call pain clinics in the area and let us know if she needs a referral.  Thanks, BJ

## 2018-11-22 NOTE — Telephone Encounter (Signed)
Patient informed. 

## 2018-11-28 ENCOUNTER — Encounter: Payer: Self-pay | Admitting: Orthopaedic Surgery

## 2018-12-05 ENCOUNTER — Ambulatory Visit (INDEPENDENT_AMBULATORY_CARE_PROVIDER_SITE_OTHER): Payer: Medicare Other | Admitting: Orthopaedic Surgery

## 2018-12-05 ENCOUNTER — Telehealth: Payer: Self-pay | Admitting: *Deleted

## 2018-12-05 ENCOUNTER — Encounter: Payer: Self-pay | Admitting: Orthopaedic Surgery

## 2018-12-05 ENCOUNTER — Other Ambulatory Visit: Payer: Self-pay

## 2018-12-05 DIAGNOSIS — M25511 Pain in right shoulder: Secondary | ICD-10-CM | POA: Diagnosis not present

## 2018-12-05 DIAGNOSIS — G8929 Other chronic pain: Secondary | ICD-10-CM | POA: Diagnosis not present

## 2018-12-05 DIAGNOSIS — M19011 Primary osteoarthritis, right shoulder: Secondary | ICD-10-CM | POA: Diagnosis not present

## 2018-12-05 MED ORDER — HYDROCODONE-ACETAMINOPHEN 5-325 MG PO TABS: 1.0000 | ORAL_TABLET | Freq: Every evening | ORAL | 0 refills | Status: DC | PRN

## 2018-12-05 NOTE — Telephone Encounter (Signed)
There is not hep C vaccine. I do not think she needs Rx for Tdap or pneumonia vaccine. She can go to her pharmacy and requested them. Tdap may not be covered by her insurance.  Thanks, BJ

## 2018-12-05 NOTE — Progress Notes (Signed)
Office Visit Note   Patient: Jessica Deleon           Date of Birth: 1950-12-20           MRN: [id number] Visit Date: 12/05/2018              Requested by: Martinique, Betty G, MD [address] [place],  [place] [address] PCP: Martinique, Betty G, MD   Assessment & Plan: Visit Diagnoses:  1. Chronic right shoulder pain   2. Primary osteoarthritis, right shoulder     Plan: Osteoarthritis right shoulder with probable rotator cuff arthropathy.  Prior films demonstrate very minimal space between the humeral head of the acromion.  Minimal superior migration of the humeral head.  Has had 2 prior rotator cuff surgeries on that same shoulder.  Will order a thin slice CT scan.  Probably will be a candidate for reverse shoulder arthroplasty.  Having difficulty sleeping at night.  Will prescribe hydrocodone.  Last prescription was 2 months ago.  Will refer to Dr. Marlou Sa after thin slice CT  Follow-Up Instructions: Return After thin slice CT scan right shoulder.   Orders:  Orders Placed This Encounter  Procedures  . CT SHOULDER RIGHT WO CONTRAST  . Ambulatory referral to Orthopedic Surgery   Meds ordered this encounter  Medications  . HYDROcodone-acetaminophen (NORCO/VICODIN) 5-325 MG tablet    Sig: Take 1 tablet by mouth at bedtime as needed for moderate pain.    Dispense:  30 tablet    Refill:  0      Procedures: No procedures performed   Clinical Data: No additional findings.   Subjective: Chief Complaint  Patient presents with  . Right Shoulder - Follow-up  Patient presents today for a right shoulder follow up. She was last seen on 10/26/2018 and received a cortisone injection. Patient states that the injection helped for about 4 weeks and hurts like it did before. She is unable to sleep due to the pain. She is here today to discuss surgery options. Has had 2 prior rotator cuff surgeries performed elsewhere on that shoulder.  Films demonstrate minimal space between the  humeral head and the acromion with small peripheral osteophytes in the humeral head.  Joint space appears to be well-maintained  HPI  Review of Systems   Objective: Vital Signs: BP (!) 145/71   Pulse 72   Ht 5\' 3"  (1.6 m)   Wt 214 lb (97.1 kg)   BMI 37.91 kg/m   Physical Exam Constitutional:      Appearance: She is well-developed.  Eyes:     Pupils: Pupils are equal, round, and reactive to light.  Pulmonary:     Effort: Pulmonary effort is normal.  Skin:    General: Skin is warm and dry.  Neurological:     Mental Status: She is alert and oriented to person, place, and time.  Psychiatric:        Behavior: Behavior normal.     Ortho Exam awake alert and oriented x3.  Comfortable sitting.  Does have painful overhead arc of motion right shoulder with limitation of motion.  Some tenderness along the anterior and lateral subacromial region.  Good grip and good release.  No tenderness at the acromioclavicular joint.  Considerable weakness with external rotation Specialty Comments:  No specialty comments available.  Imaging: No results found.   PMFS History: Patient Active Problem List   Diagnosis Date Noted  . Primary osteoarthritis, right shoulder 12/05/2018  . Rotator cuff tendonitis, right  10/26/2018  . Rotator cuff arthropathy of left shoulder 10/26/2018  . Arthritis of left acromioclavicular joint 10/26/2018  . Chronic fatigue 10/20/2018  . Hypothyroidism (acquired) 10/20/2018  . Chronic right sacroiliac pain 09/19/2018  . Insomnia 07/28/2018  . Depression, major, recurrent (HCC) 07/28/2018  . Anxiety disorder 07/28/2018  . Pernicious anemia 05/22/2018  . Vitamin D deficiency, unspecified 05/22/2018  . Venous stasis dermatitis of both lower extremities 05/17/2018  . Erythema of lower extremity 05/17/2018  . Fibromyalgia syndrome 05/17/2018  . Chronic pain disorder 05/17/2018   Past Medical History:  Diagnosis Date  . Allergy   . Arthritis   .  Diverticulitis   . GERD (gastroesophageal reflux disease)   . Heart murmur   . History of chicken pox   . Migraines   . Seizures (HCC)   . Thyroid disease   . Urine incontinence     Family History  Problem Relation Age of Onset  . Arthritis Mother   . Cancer Mother   . Hypertension Mother   . Stroke Mother   . Arthritis Father   . COPD Father   . Diabetes Father   . Early death Father   . Hearing loss Father   . Stroke Father   . Arthritis Daughter   . Cancer Daughter   . Asthma Son     Past Surgical History:  Procedure Laterality Date  . ABDOMINAL HYSTERECTOMY  1983  . APPENDECTOMY  1960  . BONE TUMOR EXCISION  1977  . CHOLECYSTECTOMY  2008  . REPLACEMENT TOTAL KNEE BILATERAL    . TONSILLECTOMY AND ADENOIDECTOMY  1954   Social History   Occupational History  . Not on file  Tobacco Use  . Smoking status: Never Smoker  . Smokeless tobacco: Never Used  Substance and Sexual Activity  . Alcohol use: Never    Frequency: Never  . Drug use: Never  . Sexual activity: Not Currently

## 2018-12-05 NOTE — Telephone Encounter (Signed)
Please advise  Copied from [place] [id number]. Topic: General - Other >> Nov 30, 2018  1:30 PM Jessica Deleon wrote: Patient requesting an order to have tetanus vaccine, hep c vaccine and pneumonia vaccine.

## 2018-12-06 ENCOUNTER — Other Ambulatory Visit: Payer: Self-pay | Admitting: Family Medicine

## 2018-12-06 NOTE — Telephone Encounter (Signed)
Requested medication (s) are due for refill today: yes  Requested medication (s) are on the active medication list: yes  Last refill: 10/31/2018  Future visit scheduled: yes  Notes to clinic:  This refill cannot be delegated  Requested Prescriptions  Pending Prescriptions Disp Refills   tiZANidine (ZANAFLEX) 2 MG tablet 90 tablet 0    Sig: TAKE 1 TO 2 TABLETS BY MOUTH AT BEDTIME AS NEEDED FOR MUSCLE SPASMS     Not Delegated - Cardiovascular:  Alpha-2 [facility]onists - tizanidine Failed - 12/06/2018  9:40 AM      Failed - This refill cannot be delegated      Passed - Valid encounter within last 6 months    Recent Outpatient Visits          1 month ago Pernicious anemia   [facility] [place], Malka So, MD   2 months ago Chronic right-sided low back pain, unspecified whether sciatica present   [facility] [place], Malka So, MD   4 months ago Insomnia, unspecified type   [facility] [place], Malka So, MD   6 months ago Reactive airway disease that is not asthma   [facility] [place], Malka So, MD   6 months ago Chronic pain disorder   [facility] [place], Malka So, MD      Future Appointments            In 4 weeks Marlou Sa, Tonna Corner, MD [facility]

## 2018-12-06 NOTE — Telephone Encounter (Signed)
Medication Refill - Medication: tiZANidine (ZANAFLEX) 2 MG tablet   Has the patient contacted their pharmacy? No. (Agent: If no, request that the patient contact the pharmacy for the refill.) (Agent: If yes, when and what did the pharmacy advise?)  Preferred Pharmacy (with phone number or street name):  [id number] [place], [place] [address]  Phone: [phone] Fax: [phone]  Not a 24 hour pharmacy; exact hours not known.     Agent: Please be advised that RX refills may take up to 3 business days. We ask that you follow-up with your pharmacy.

## 2018-12-07 ENCOUNTER — Ambulatory Visit
Admission: RE | Admit: 2018-12-07 | Discharge: 2018-12-07 | Disposition: A | Discharge status: Home / Self Care | Payer: Medicare Other | Source: Ambulatory Visit | Attending: Orthopaedic Surgery | Admitting: Orthopaedic Surgery

## 2018-12-07 ENCOUNTER — Other Ambulatory Visit: Payer: Self-pay

## 2018-12-07 DIAGNOSIS — G8929 Other chronic pain: Secondary | ICD-10-CM

## 2018-12-07 DIAGNOSIS — M19011 Primary osteoarthritis, right shoulder: Secondary | ICD-10-CM | POA: Diagnosis not present

## 2018-12-08 ENCOUNTER — Ambulatory Visit (INDEPENDENT_AMBULATORY_CARE_PROVIDER_SITE_OTHER): Payer: Medicare Other | Admitting: *Deleted

## 2018-12-08 ENCOUNTER — Other Ambulatory Visit: Payer: Self-pay

## 2018-12-08 ENCOUNTER — Encounter: Payer: Self-pay | Admitting: *Deleted

## 2018-12-08 DIAGNOSIS — E538 Deficiency of other specified B group vitamins: Secondary | ICD-10-CM

## 2018-12-08 DIAGNOSIS — Z23 Encounter for immunization: Secondary | ICD-10-CM

## 2018-12-08 MED ORDER — CYANOCOBALAMIN 1000 MCG/ML IJ SOLN
1000.0000 ug | Freq: Once | INTRAMUSCULAR | Status: AC
  Administered 2018-12-08: 1000 ug via INTRAMUSCULAR

## 2018-12-08 MED ORDER — TIZANIDINE HCL 2 MG PO TABS: ORAL_TABLET | ORAL | 1 refills | Status: AC

## 2018-12-08 NOTE — Telephone Encounter (Signed)
Patient informed. 

## 2018-12-08 NOTE — Progress Notes (Signed)
Patient in clinic today for B-12 and flu vaccine. Both administered with no reactions.

## 2018-12-11 ENCOUNTER — Encounter: Payer: Self-pay | Admitting: Family Medicine

## 2018-12-12 DIAGNOSIS — M48061 Spinal stenosis, lumbar region without neurogenic claudication: Secondary | ICD-10-CM | POA: Diagnosis not present

## 2018-12-12 DIAGNOSIS — M961 Postlaminectomy syndrome, not elsewhere classified: Secondary | ICD-10-CM | POA: Diagnosis not present

## 2018-12-12 DIAGNOSIS — M545 Low back pain: Secondary | ICD-10-CM | POA: Diagnosis not present

## 2018-12-12 DIAGNOSIS — M7918 Myalgia, other site: Secondary | ICD-10-CM | POA: Diagnosis not present

## 2018-12-12 DIAGNOSIS — Z96659 Presence of unspecified artificial knee joint: Secondary | ICD-10-CM | POA: Diagnosis not present

## 2018-12-12 DIAGNOSIS — M533 Sacrococcygeal disorders, not elsewhere classified: Secondary | ICD-10-CM | POA: Diagnosis not present

## 2018-12-12 DIAGNOSIS — M75122 Complete rotator cuff tear or rupture of left shoulder, not specified as traumatic: Secondary | ICD-10-CM | POA: Diagnosis not present

## 2018-12-13 ENCOUNTER — Other Ambulatory Visit: Payer: Self-pay | Admitting: Family Medicine

## 2018-12-13 DIAGNOSIS — I872 Venous insufficiency (chronic) (peripheral): Secondary | ICD-10-CM

## 2018-12-22 DIAGNOSIS — Z96659 Presence of unspecified artificial knee joint: Secondary | ICD-10-CM | POA: Diagnosis not present

## 2018-12-22 DIAGNOSIS — M7918 Myalgia, other site: Secondary | ICD-10-CM | POA: Diagnosis not present

## 2018-12-22 DIAGNOSIS — M533 Sacrococcygeal disorders, not elsewhere classified: Secondary | ICD-10-CM | POA: Diagnosis not present

## 2018-12-22 DIAGNOSIS — M961 Postlaminectomy syndrome, not elsewhere classified: Secondary | ICD-10-CM | POA: Diagnosis not present

## 2018-12-22 DIAGNOSIS — M75122 Complete rotator cuff tear or rupture of left shoulder, not specified as traumatic: Secondary | ICD-10-CM | POA: Diagnosis not present

## 2018-12-25 ENCOUNTER — Encounter: Payer: Self-pay | Admitting: Orthopedic Surgery

## 2018-12-25 ENCOUNTER — Ambulatory Visit (INDEPENDENT_AMBULATORY_CARE_PROVIDER_SITE_OTHER): Payer: Medicare Other | Admitting: Orthopedic Surgery

## 2018-12-25 DIAGNOSIS — M79605 Pain in left leg: Secondary | ICD-10-CM | POA: Diagnosis not present

## 2018-12-25 DIAGNOSIS — M79604 Pain in right leg: Secondary | ICD-10-CM

## 2018-12-25 DIAGNOSIS — M12812 Other specific arthropathies, not elsewhere classified, left shoulder: Secondary | ICD-10-CM | POA: Diagnosis not present

## 2018-12-26 ENCOUNTER — Encounter: Payer: Self-pay | Admitting: Orthopedic Surgery

## 2018-12-26 NOTE — Progress Notes (Signed)
Office Visit Note   Patient: Jessica Deleon           Date of Birth: April 07, 1951           MRN: [id number] Visit Date: 12/25/2018 Requested by: Garald Balding, MD [address] [place],  [place] [address] PCP: Martinique, Betty G, MD  Subjective: Chief Complaint  Patient presents with   Right Shoulder - Pain    HPI: Jessica Deleon is a patient with right shoulder pain.  She is had 3 prior rotator cuff surgeries.  Moved to [place] at the end of January.  She states she reinjured it when she was lifting a box.  She reports pain is her primary complaint.  The pain does wake her from sleep at night.  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  She has had bilateral total knee replacements.  No personal or family history of DVT or pulmonary embolism.  She has had previous cervical spine pathology with MRI scan showing some stenosis and bulging disc.  She reports primarily palmar sided numbness and tingling as opposed to dorsal sided numbness and tingling in that right hand.  She did have an injection in the subacromial space which gave her about 3 weeks of relief.  She takes care of her 30 year old mother who is at home.  She can only sleep about 2 hours at a time.  She denies any fevers or chills.  She also has right ankle pain and per her history is in need of ankle replacement.  She also has been requested to have nerve conduction study done of her bilateral lower extremities by her spine surgeon in the [place].              ROS: All systems reviewed are negative as they relate to the chief complaint within the history of present illness.  Patient denies  fevers or chills.   Assessment & Plan: Visit Diagnoses:  1. Bilateral leg pain   2. Rotator cuff arthropathy of left shoulder     Plan: Impression is symptomatic right shoulder rotator cuff arthropathy with no real evidence of occult or indolent infection.  CT scan does show some wasting of the supraspinatus as well as  some superior migration of the humeral head.  She is had 3 prior surgeries.  Shoulder itself today is not warm.  I think based on her age and activity level and positive response to a subacromial injection that some of this pain could be relieved with shoulder replacement.  She wants to try to get some of these things taken care of so she can rest easier.  She does not require her right shoulder for weightbearing.  The risk and benefits of reverse shoulder replacement are shown to Jessica Deleon with the use of a model.  The risks include infection nerve vessel damage instability as well as 10 to 15-year longevity of the implant.  Patient understands risk and benefits and wishes to proceed.  All questions answered.  Follow-Up Instructions: No follow-ups on file.   Orders:  Orders Placed This Encounter  Procedures   Ambulatory referral to Physical Medicine Rehab   No orders of the defined types were placed in this encounter.     Procedures: No procedures performed   Clinical Data: No additional findings.  Objective: Vital Signs: There were no vitals taken for this visit.  Physical Exam:   Constitutional: Patient appears well-developed HEENT:  Head: Normocephalic Eyes:EOM are normal Neck: Normal range  of motion Cardiovascular: Normal rate Pulmonary/chest: Effort normal Neurologic: Patient is alert Skin: Skin is warm Psychiatric: Patient has normal mood and affect    Ortho Exam: Ortho exam demonstrates forward flexion abduction just above 90 bilaterally.  Radial pulse intact bilaterally.  She does have a little bit of supraspinatus weakness on the right compared to the left.  She actually has a little bit more coarseness and grinding in the left shoulder than the right shoulder.  Does have pain with range of motion on the right.  External rotation strength is symmetric bilaterally.  Specialty Comments:  No specialty comments available.  Imaging: No results found.   PMFS  History: Patient Active Problem List   Diagnosis Date Noted   Primary osteoarthritis, right shoulder 12/05/2018   Rotator cuff tendonitis, right 10/26/2018   Rotator cuff arthropathy of left shoulder 10/26/2018   Arthritis of left acromioclavicular joint 10/26/2018   Chronic fatigue 10/20/2018   Hypothyroidism (acquired) 10/20/2018   Chronic right sacroiliac pain 09/19/2018   Insomnia 07/28/2018   Depression, major, recurrent (HCC) 07/28/2018   Anxiety disorder 07/28/2018   Pernicious anemia 05/22/2018   Vitamin D deficiency, unspecified 05/22/2018   Venous stasis dermatitis of both lower extremities 05/17/2018   Erythema of lower extremity 05/17/2018   Fibromyalgia syndrome 05/17/2018   Chronic pain disorder 05/17/2018   Past Medical History:  Diagnosis Date   Allergy    Arthritis    Diverticulitis    GERD (gastroesophageal reflux disease)    Heart murmur    History of chicken pox    Migraines    Seizures (HCC)    Thyroid disease    Urine incontinence     Family History  Problem Relation Age of Onset   Arthritis Mother    Cancer Mother    Hypertension Mother    Stroke Mother    Arthritis Father    COPD Father    Diabetes Father    Early death Father    Hearing loss Father    Stroke Father    Arthritis Daughter    Cancer Daughter    Asthma Son     Past Surgical History:  Procedure Laterality Date   ABDOMINAL HYSTERECTOMY  1983   APPENDECTOMY  1960   BONE TUMOR EXCISION  1977   CHOLECYSTECTOMY  2008   REPLACEMENT TOTAL KNEE BILATERAL     TONSILLECTOMY AND ADENOIDECTOMY  1954   Social History   Occupational History   Not on file  Tobacco Use   Smoking status: Never Smoker   Smokeless tobacco: Never Used  Substance and Sexual Activity   Alcohol use: Never    Frequency: Never   Drug use: Never   Sexual activity: Not Currently

## 2018-12-27 ENCOUNTER — Ambulatory Visit: Payer: Medicare Other | Admitting: Orthopedic Surgery

## 2018-12-28 ENCOUNTER — Other Ambulatory Visit: Payer: Self-pay

## 2018-12-28 ENCOUNTER — Encounter: Payer: Self-pay | Admitting: Family Medicine

## 2018-12-28 DIAGNOSIS — M79605 Pain in left leg: Secondary | ICD-10-CM

## 2018-12-28 DIAGNOSIS — M79604 Pain in right leg: Secondary | ICD-10-CM

## 2018-12-29 ENCOUNTER — Other Ambulatory Visit: Payer: Self-pay

## 2018-12-29 ENCOUNTER — Encounter: Payer: Self-pay | Admitting: Family Medicine

## 2018-12-29 DIAGNOSIS — R6889 Other general symptoms and signs: Secondary | ICD-10-CM | POA: Diagnosis not present

## 2018-12-29 DIAGNOSIS — Z20822 Contact with and (suspected) exposure to covid-19: Secondary | ICD-10-CM

## 2018-12-30 LAB — NOVEL CORONAVIRUS, NAA: SARS-CoV-2, NAA: NOT DETECTED

## 2019-01-02 ENCOUNTER — Telehealth: Payer: Self-pay | Admitting: Orthopedic Surgery

## 2019-01-02 NOTE — Telephone Encounter (Signed)
Please advise. Thanks.  

## 2019-01-02 NOTE — Telephone Encounter (Signed)
Patient called. Says she needs something for pain. Her shoulder is hurting really bad. Her call back number is [phone]

## 2019-01-02 NOTE — Telephone Encounter (Signed)
IC s/w patient and she said that tramadol does not work for her.

## 2019-01-03 ENCOUNTER — Ambulatory Visit: Payer: Medicare Other | Admitting: Orthopedic Surgery

## 2019-01-05 ENCOUNTER — Other Ambulatory Visit: Payer: Self-pay | Admitting: Surgical

## 2019-01-05 ENCOUNTER — Institutional Professional Consult (permissible substitution): Payer: Medicare Other | Admitting: Internal Medicine

## 2019-01-05 MED ORDER — HYDROCODONE-ACETAMINOPHEN 5-325 MG PO TABS: 1.0000 | ORAL_TABLET | Freq: Every day | ORAL | 0 refills | Status: AC | PRN

## 2019-01-05 NOTE — Telephone Encounter (Signed)
IC s/w patient and advised  

## 2019-01-08 DIAGNOSIS — G603 Idiopathic progressive neuropathy: Secondary | ICD-10-CM | POA: Diagnosis not present

## 2019-01-08 DIAGNOSIS — M542 Cervicalgia: Secondary | ICD-10-CM | POA: Diagnosis not present

## 2019-01-08 DIAGNOSIS — R201 Hypoesthesia of skin: Secondary | ICD-10-CM | POA: Diagnosis not present

## 2019-01-08 DIAGNOSIS — R202 Paresthesia of skin: Secondary | ICD-10-CM | POA: Diagnosis not present

## 2019-01-09 ENCOUNTER — Other Ambulatory Visit: Payer: Self-pay

## 2019-01-10 ENCOUNTER — Ambulatory Visit: Payer: Medicare Other

## 2019-01-11 DIAGNOSIS — R202 Paresthesia of skin: Secondary | ICD-10-CM | POA: Diagnosis not present

## 2019-01-11 DIAGNOSIS — R201 Hypoesthesia of skin: Secondary | ICD-10-CM | POA: Diagnosis not present

## 2019-01-11 DIAGNOSIS — M5416 Radiculopathy, lumbar region: Secondary | ICD-10-CM | POA: Diagnosis not present

## 2019-01-11 DIAGNOSIS — M542 Cervicalgia: Secondary | ICD-10-CM | POA: Diagnosis not present

## 2019-01-11 DIAGNOSIS — G603 Idiopathic progressive neuropathy: Secondary | ICD-10-CM | POA: Diagnosis not present

## 2019-01-12 ENCOUNTER — Ambulatory Visit: Payer: Medicare Other

## 2019-01-18 ENCOUNTER — Ambulatory Visit (INDEPENDENT_AMBULATORY_CARE_PROVIDER_SITE_OTHER): Payer: Medicare Other | Admitting: Pulmonary Disease

## 2019-01-18 ENCOUNTER — Other Ambulatory Visit: Payer: Self-pay

## 2019-01-18 ENCOUNTER — Encounter: Payer: Self-pay | Admitting: Pulmonary Disease

## 2019-01-18 VITALS — BP 144/82 | HR 63 | Ht 63.0 in | Wt 219.6 lb

## 2019-01-18 DIAGNOSIS — G4709 Other insomnia: Secondary | ICD-10-CM

## 2019-01-18 NOTE — Patient Instructions (Signed)
Insomnia Fatigue Severe stress  I do not believe we need to do any studies at the present time  Continue weight loss efforts as able  Changes in your environment that reduces your stress level needs to be put in place  I will be happy to see you as needed  Call with concerns

## 2019-01-18 NOTE — Progress Notes (Signed)
Subjective:    Patient ID: Jessica Deleon, female    DOB: [birth year], 68 y.o.   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, elderly and requires a lot of attention  There is a lot of stress driving insomnia  She is tired during the day, chronic fatigue relating to not getting enough sleep  She does not remember any time in recent memory where she is able to sleep through the night without having to attend to her mother  Usually tries to go to bed between 11 and 12 Takes about 30 to 40 minutes to fall asleep  Wakes up about every 1-2 hours  Final awakening time about 9 AM  Has gained about 20 pounds recently  She is being worked up for shoulder surgery-shoulder replacement surgery Following shoulder surgery, she will will also require ankle surgery  Retired Equities trader  Never smoker  Past Medical History:  Diagnosis Date  . Allergy   . Arthritis   . Diverticulitis   . GERD (gastroesophageal reflux disease)   . Heart murmur   . History of chicken pox   . Migraines   . Seizures ([place])   . Thyroid disease   . Urine incontinence    Social History   Socioeconomic History  . Marital status: Widowed    Spouse name: Not on file  . Number of children: 4  . Years of education: Not on file  . Highest education level: Not on file  Occupational History  . Not on file  Social Needs  . Financial resource strain: Not on file  . Food insecurity    Worry: Not on file    Inability: Not on file  . Transportation needs    Medical: Not on file    Non-medical: Not on file  Tobacco Use  . Smoking status: Never  Smoker  . Smokeless tobacco: Never Used  Substance and Sexual Activity  . Alcohol use: Never    Frequency: Never  . Drug use: Never  . Sexual activity: Not Currently  Lifestyle  . Physical activity    Days per week: Not on file    Minutes per session: Not on file  . Stress: Not on file  Relationships  . Social Herbalist on phone: Not on file    Gets together: Not on file    Attends religious service: Not on file    Active member of club or organization: Not on file    Attends meetings of clubs or organizations: Not on file    Relationship status: Not on file  . Intimate partner violence    Fear of current or ex partner: Not on file    Emotionally abused: Not on file    Physically abused: Not on file    Forced sexual activity: Not on file  Other Topics Concern  . Not on file  Social History Narrative  . Not on file   Family History  Problem Relation Age of Onset  . Arthritis Mother   . Cancer Mother   . Hypertension Mother   . Stroke Mother   . Arthritis Father   . COPD Father   .  Diabetes Father   . Early death Father   . Hearing loss Father   . Stroke Father   . Arthritis Daughter   . Cancer Daughter   . Asthma Son     Review of Systems  Constitutional: Positive for unexpected weight change. Negative for fever.  HENT: Negative for congestion, dental problem, ear pain, nosebleeds, postnasal drip, rhinorrhea, sinus pressure, sneezing, sore throat and trouble swallowing.   Eyes: Negative for redness and itching.  Respiratory: Negative for cough, chest tightness, shortness of breath and wheezing.   Cardiovascular: Positive for leg swelling. Negative for palpitations.  Gastrointestinal: Negative for nausea and vomiting.  Genitourinary: Negative for dysuria.  Musculoskeletal: Positive for joint swelling.  Skin: Negative for rash.  Allergic/Immunologic: Positive for environmental allergies. Negative for food allergies and immunocompromised state.   Neurological: Negative for headaches.  Hematological: Does not bruise/bleed easily.  Psychiatric/Behavioral: Negative for dysphoric mood. The patient is not nervous/anxious.       Objective:   Physical Exam Vitals signs reviewed.  Constitutional:      Appearance: Normal appearance.  HENT:     Head: Normocephalic and atraumatic.     Nose: No rhinorrhea.     Mouth/Throat:     Mouth: Mucous membranes are moist.  Eyes:     General:        Right eye: No discharge.        Left eye: No discharge.     Pupils: Pupils are equal, round, and reactive to light.  Neck:     Musculoskeletal: Normal range of motion and neck supple. No neck rigidity.  Cardiovascular:     Rate and Rhythm: Normal rate and regular rhythm.     Pulses: Normal pulses.     Heart sounds: No murmur. No friction rub.  Pulmonary:     Effort: Pulmonary effort is normal. No respiratory distress.     Breath sounds: Normal breath sounds. No stridor. No wheezing or rhonchi.  Abdominal:     General: There is no distension.  Musculoskeletal:        General: Swelling present.  Skin:    General: Skin is warm.  Neurological:     General: No focal deficit present.     Mental Status: She is alert.  Psychiatric:        Mood and Affect: Mood normal.    Vitals:   01/18/19 0910  BP: (!) 144/82  Pulse: 63  SpO2: 93%      Assessment & Plan:  .  Sleep onset and sleep maintenance insomnia  .  Uncontrolled pain and discomfort appears to be contributing to symptomatology  .  Stress of being a caregiver appears to be contributing significantly  .  She does have a history of obstructive sleep apnea, confirmatory testing following surgery did show that sleep apnea was controlled, not having significant symptoms at present apart from daytime sleepiness which can be attributed to inadequate sleep  .  I do not believe any testing is needed at present  .  Factors contributing to her insomnia need to be addressed  .  Evaluation  of pain management  .  Significant stress the patient is taking with regards to being a caregiver for her mother needs to change, any sedative may make her unable to be able to provide care  .  Does not have other significant symptoms to suggest recurrence of her sleep disordered breathing  I will be glad to see her as needed No testing required at  present

## 2019-01-22 ENCOUNTER — Ambulatory Visit (INDEPENDENT_AMBULATORY_CARE_PROVIDER_SITE_OTHER): Payer: Medicare Other | Admitting: *Deleted

## 2019-01-22 ENCOUNTER — Other Ambulatory Visit: Payer: Self-pay

## 2019-01-22 DIAGNOSIS — E538 Deficiency of other specified B group vitamins: Secondary | ICD-10-CM

## 2019-01-22 MED ORDER — CYANOCOBALAMIN 1000 MCG/ML IJ SOLN
1000.0000 ug | Freq: Once | INTRAMUSCULAR | Status: AC
  Administered 2019-01-22: 1000 ug via INTRAMUSCULAR

## 2019-01-22 NOTE — Progress Notes (Signed)
Patient here for B-12 injection. Injection administered with no reactions  

## 2019-01-29 DIAGNOSIS — M75121 Complete rotator cuff tear or rupture of right shoulder, not specified as traumatic: Secondary | ICD-10-CM | POA: Diagnosis not present

## 2019-01-29 DIAGNOSIS — M25511 Pain in right shoulder: Secondary | ICD-10-CM | POA: Diagnosis not present

## 2019-01-31 DIAGNOSIS — M961 Postlaminectomy syndrome, not elsewhere classified: Secondary | ICD-10-CM | POA: Diagnosis not present

## 2019-01-31 DIAGNOSIS — M48061 Spinal stenosis, lumbar region without neurogenic claudication: Secondary | ICD-10-CM | POA: Diagnosis not present

## 2019-01-31 DIAGNOSIS — M5416 Radiculopathy, lumbar region: Secondary | ICD-10-CM | POA: Diagnosis not present

## 2019-02-01 ENCOUNTER — Encounter: Payer: Medicare Other | Admitting: Neurology

## 2019-02-02 ENCOUNTER — Other Ambulatory Visit (HOSPITAL_COMMUNITY): Payer: Medicare Other

## 2019-02-03 DIAGNOSIS — M75121 Complete rotator cuff tear or rupture of right shoulder, not specified as traumatic: Secondary | ICD-10-CM | POA: Diagnosis not present

## 2019-02-09 ENCOUNTER — Telehealth: Payer: Self-pay | Admitting: Orthopedic Surgery

## 2019-02-09 NOTE — Telephone Encounter (Signed)
FYI:  Patient cancelled Right RSA with Dr. Marlou Sa for 02-20-19 at [facility].  She has decided to move back to [place] where she will have more support for all surgeries needed.  She also states her pain management doctor of 10 years is there.

## 2019-02-12 DIAGNOSIS — M75121 Complete rotator cuff tear or rupture of right shoulder, not specified as traumatic: Secondary | ICD-10-CM | POA: Diagnosis not present

## 2019-02-12 NOTE — Telephone Encounter (Signed)
FYI

## 2019-02-14 DIAGNOSIS — G2581 Restless legs syndrome: Secondary | ICD-10-CM | POA: Diagnosis not present

## 2019-02-14 DIAGNOSIS — Z01818 Encounter for other preprocedural examination: Secondary | ICD-10-CM | POA: Diagnosis not present

## 2019-02-14 DIAGNOSIS — E039 Hypothyroidism, unspecified: Secondary | ICD-10-CM | POA: Diagnosis not present

## 2019-02-14 DIAGNOSIS — R011 Cardiac murmur, unspecified: Secondary | ICD-10-CM | POA: Diagnosis not present

## 2019-02-14 DIAGNOSIS — M19011 Primary osteoarthritis, right shoulder: Secondary | ICD-10-CM | POA: Diagnosis not present

## 2019-02-16 ENCOUNTER — Other Ambulatory Visit (HOSPITAL_COMMUNITY): Payer: Medicare Other

## 2019-02-16 DIAGNOSIS — I119 Hypertensive heart disease without heart failure: Secondary | ICD-10-CM | POA: Diagnosis not present

## 2019-02-16 DIAGNOSIS — I517 Cardiomegaly: Secondary | ICD-10-CM | POA: Diagnosis not present

## 2019-02-16 DIAGNOSIS — I2510 Atherosclerotic heart disease of native coronary artery without angina pectoris: Secondary | ICD-10-CM | POA: Diagnosis not present

## 2019-02-16 DIAGNOSIS — R0602 Shortness of breath: Secondary | ICD-10-CM | POA: Diagnosis not present

## 2019-02-16 DIAGNOSIS — Z01810 Encounter for preprocedural cardiovascular examination: Secondary | ICD-10-CM | POA: Diagnosis not present

## 2019-02-20 ENCOUNTER — Encounter (HOSPITAL_COMMUNITY): Admission: RE | Payer: Self-pay | Source: Home / Self Care

## 2019-02-20 ENCOUNTER — Inpatient Hospital Stay (HOSPITAL_COMMUNITY): Admission: RE | Admit: 2019-02-20 | Payer: Medicare Other | Source: Home / Self Care | Admitting: Orthopedic Surgery

## 2019-02-20 SURGERY — ARTHROPLASTY, SHOULDER, TOTAL, REVERSE
Anesthesia: General | Site: Shoulder | Laterality: Right

## 2019-02-21 ENCOUNTER — Inpatient Hospital Stay: Payer: Medicare Other | Admitting: Orthopedic Surgery

## 2019-02-23 ENCOUNTER — Telehealth: Payer: Self-pay | Admitting: Family Medicine

## 2019-02-23 ENCOUNTER — Ambulatory Visit: Payer: Medicare Other

## 2019-02-23 NOTE — Telephone Encounter (Signed)
Attempted to contact office they were closed.  Attempted to contact patient. No answer. LVM that she will need to complete  a ROI there and have it faxed to the office

## 2019-02-23 NOTE — Telephone Encounter (Signed)
Cristie Hem is calling from [employer], in [place] is  calling to request most recent OV notes, and anything to justify the patient needing B-12 shots. Patient is wanting B-12 shots in [place]. [phone]  Fax- [phone]

## 2019-02-27 DIAGNOSIS — I2584 Coronary atherosclerosis due to calcified coronary lesion: Secondary | ICD-10-CM | POA: Diagnosis not present

## 2019-02-27 DIAGNOSIS — Z01810 Encounter for preprocedural cardiovascular examination: Secondary | ICD-10-CM | POA: Diagnosis not present

## 2019-02-27 DIAGNOSIS — R0602 Shortness of breath: Secondary | ICD-10-CM | POA: Diagnosis not present

## 2019-03-07 ENCOUNTER — Inpatient Hospital Stay: Payer: Medicare Other | Admitting: Orthopedic Surgery

## 2019-03-09 DIAGNOSIS — Z01818 Encounter for other preprocedural examination: Secondary | ICD-10-CM | POA: Diagnosis not present

## 2019-03-09 DIAGNOSIS — Z1159 Encounter for screening for other viral diseases: Secondary | ICD-10-CM | POA: Diagnosis not present

## 2019-03-10 DIAGNOSIS — G8191 Hemiplegia, unspecified affecting right dominant side: Secondary | ICD-10-CM | POA: Diagnosis not present

## 2019-03-10 DIAGNOSIS — R4701 Aphasia: Secondary | ICD-10-CM | POA: Diagnosis not present

## 2019-03-10 DIAGNOSIS — I6602 Occlusion and stenosis of left middle cerebral artery: Secondary | ICD-10-CM | POA: Diagnosis not present

## 2019-03-10 DIAGNOSIS — R130 Aphagia: Secondary | ICD-10-CM | POA: Diagnosis not present

## 2019-03-10 DIAGNOSIS — Z1159 Encounter for screening for other viral diseases: Secondary | ICD-10-CM | POA: Diagnosis not present

## 2019-03-10 DIAGNOSIS — I4891 Unspecified atrial fibrillation: Secondary | ICD-10-CM | POA: Diagnosis not present

## 2019-03-10 DIAGNOSIS — I63412 Cerebral infarction due to embolism of left middle cerebral artery: Secondary | ICD-10-CM | POA: Diagnosis not present

## 2019-03-10 DIAGNOSIS — R402221 Coma scale, best verbal response, incomprehensible words, in the field [EMT or ambulance]: Secondary | ICD-10-CM | POA: Diagnosis not present

## 2019-03-11 DIAGNOSIS — R29704 NIHSS score 4: Secondary | ICD-10-CM | POA: Diagnosis present

## 2019-03-11 DIAGNOSIS — H5340 Unspecified visual field defects: Secondary | ICD-10-CM | POA: Diagnosis present

## 2019-03-11 DIAGNOSIS — R402361 Coma scale, best motor response, obeys commands, in the field [EMT or ambulance]: Secondary | ICD-10-CM | POA: Diagnosis present

## 2019-03-11 DIAGNOSIS — G43909 Migraine, unspecified, not intractable, without status migrainosus: Secondary | ICD-10-CM | POA: Diagnosis present

## 2019-03-11 DIAGNOSIS — Z881 Allergy status to other antibiotic agents status: Secondary | ICD-10-CM | POA: Diagnosis not present

## 2019-03-11 DIAGNOSIS — R402141 Coma scale, eyes open, spontaneous, in the field [EMT or ambulance]: Secondary | ICD-10-CM | POA: Diagnosis present

## 2019-03-11 DIAGNOSIS — I4891 Unspecified atrial fibrillation: Secondary | ICD-10-CM | POA: Diagnosis present

## 2019-03-11 DIAGNOSIS — I63512 Cerebral infarction due to unspecified occlusion or stenosis of left middle cerebral artery: Secondary | ICD-10-CM | POA: Diagnosis not present

## 2019-03-11 DIAGNOSIS — E669 Obesity, unspecified: Secondary | ICD-10-CM | POA: Diagnosis present

## 2019-03-11 DIAGNOSIS — J9811 Atelectasis: Secondary | ICD-10-CM | POA: Diagnosis not present

## 2019-03-11 DIAGNOSIS — R471 Dysarthria and anarthria: Secondary | ICD-10-CM | POA: Diagnosis present

## 2019-03-11 DIAGNOSIS — Z6839 Body mass index (BMI) 39.0-39.9, adult: Secondary | ICD-10-CM | POA: Diagnosis not present

## 2019-03-11 DIAGNOSIS — R29810 Facial weakness: Secondary | ICD-10-CM | POA: Diagnosis present

## 2019-03-11 DIAGNOSIS — F419 Anxiety disorder, unspecified: Secondary | ICD-10-CM | POA: Diagnosis present

## 2019-03-11 DIAGNOSIS — R4701 Aphasia: Secondary | ICD-10-CM | POA: Diagnosis present

## 2019-03-11 DIAGNOSIS — R4781 Slurred speech: Secondary | ICD-10-CM | POA: Diagnosis present

## 2019-03-11 DIAGNOSIS — Z880 Allergy status to penicillin: Secondary | ICD-10-CM | POA: Diagnosis not present

## 2019-03-11 DIAGNOSIS — I63412 Cerebral infarction due to embolism of left middle cerebral artery: Secondary | ICD-10-CM | POA: Diagnosis present

## 2019-03-11 DIAGNOSIS — Z20828 Contact with and (suspected) exposure to other viral communicable diseases: Secondary | ICD-10-CM | POA: Diagnosis present

## 2019-03-11 DIAGNOSIS — R402221 Coma scale, best verbal response, incomprehensible words, in the field [EMT or ambulance]: Secondary | ICD-10-CM | POA: Diagnosis present

## 2019-03-11 DIAGNOSIS — G4733 Obstructive sleep apnea (adult) (pediatric): Secondary | ICD-10-CM | POA: Diagnosis present

## 2019-03-11 DIAGNOSIS — Z882 Allergy status to sulfonamides status: Secondary | ICD-10-CM | POA: Diagnosis not present

## 2019-03-11 DIAGNOSIS — R130 Aphagia: Secondary | ICD-10-CM | POA: Diagnosis present

## 2019-03-11 DIAGNOSIS — I6789 Other cerebrovascular disease: Secondary | ICD-10-CM | POA: Diagnosis not present

## 2019-03-11 DIAGNOSIS — G8191 Hemiplegia, unspecified affecting right dominant side: Secondary | ICD-10-CM | POA: Diagnosis present

## 2019-03-11 DIAGNOSIS — E039 Hypothyroidism, unspecified: Secondary | ICD-10-CM | POA: Diagnosis present

## 2019-03-11 DIAGNOSIS — I10 Essential (primary) hypertension: Secondary | ICD-10-CM | POA: Diagnosis present

## 2019-03-11 DIAGNOSIS — Z9049 Acquired absence of other specified parts of digestive tract: Secondary | ICD-10-CM | POA: Diagnosis not present

## 2019-03-11 DIAGNOSIS — I639 Cerebral infarction, unspecified: Secondary | ICD-10-CM | POA: Diagnosis not present

## 2019-03-12 ENCOUNTER — Telehealth: Payer: Self-pay | Admitting: *Deleted

## 2019-03-12 NOTE — Telephone Encounter (Signed)
Copied from [place] [id number]. Topic: General - Inquiry >> Mar 12, 2019  1:05 PM Berneta Levins wrote: Reason for CRM:  Pt's daughter, Jessica Deleon, calling.  States that pt is in [place] and has had a stroke.  The hospital needs to know what medications that the pt is on and the daughter doesn't know what to tell them. Daughter states that pt is at [facility], [facility] in [place].  Daughter wants to know if PCP can send her a list or get this information to the nurses station where pt is because they will not allow daughter in due to restrictions. Nurses station number is [phone] or [phone] Jessica Deleon can be reached at [phone].

## 2019-03-13 NOTE — Telephone Encounter (Signed)
Spoke with the patients daughter and asked that she have the hospital send over a release form. She stated the information is no longer needed. Nothing further needed.

## 2019-03-14 DIAGNOSIS — Z66 Do not resuscitate: Secondary | ICD-10-CM | POA: Diagnosis present

## 2019-03-14 DIAGNOSIS — Z9049 Acquired absence of other specified parts of digestive tract: Secondary | ICD-10-CM | POA: Diagnosis not present

## 2019-03-14 DIAGNOSIS — I63512 Cerebral infarction due to unspecified occlusion or stenosis of left middle cerebral artery: Secondary | ICD-10-CM | POA: Diagnosis not present

## 2019-03-14 DIAGNOSIS — Z7982 Long term (current) use of aspirin: Secondary | ICD-10-CM | POA: Diagnosis not present

## 2019-03-14 DIAGNOSIS — Z7409 Other reduced mobility: Secondary | ICD-10-CM | POA: Diagnosis not present

## 2019-03-14 DIAGNOSIS — E039 Hypothyroidism, unspecified: Secondary | ICD-10-CM | POA: Diagnosis present

## 2019-03-14 DIAGNOSIS — I69318 Other symptoms and signs involving cognitive functions following cerebral infarction: Secondary | ICD-10-CM | POA: Diagnosis not present

## 2019-03-14 DIAGNOSIS — G8929 Other chronic pain: Secondary | ICD-10-CM | POA: Diagnosis present

## 2019-03-14 DIAGNOSIS — E669 Obesity, unspecified: Secondary | ICD-10-CM | POA: Diagnosis present

## 2019-03-14 DIAGNOSIS — Z881 Allergy status to other antibiotic agents status: Secondary | ICD-10-CM | POA: Diagnosis not present

## 2019-03-14 DIAGNOSIS — G4733 Obstructive sleep apnea (adult) (pediatric): Secondary | ICD-10-CM | POA: Diagnosis present

## 2019-03-14 DIAGNOSIS — I69320 Aphasia following cerebral infarction: Secondary | ICD-10-CM | POA: Diagnosis not present

## 2019-03-14 DIAGNOSIS — I69314 Frontal lobe and executive function deficit following cerebral infarction: Secondary | ICD-10-CM | POA: Diagnosis not present

## 2019-03-14 DIAGNOSIS — I4891 Unspecified atrial fibrillation: Secondary | ICD-10-CM | POA: Diagnosis present

## 2019-03-14 DIAGNOSIS — I10 Essential (primary) hypertension: Secondary | ICD-10-CM | POA: Diagnosis present

## 2019-03-14 DIAGNOSIS — R4701 Aphasia: Secondary | ICD-10-CM | POA: Diagnosis not present

## 2019-03-14 DIAGNOSIS — I63412 Cerebral infarction due to embolism of left middle cerebral artery: Secondary | ICD-10-CM | POA: Diagnosis not present

## 2019-03-14 DIAGNOSIS — F419 Anxiety disorder, unspecified: Secondary | ICD-10-CM | POA: Diagnosis present

## 2019-03-14 DIAGNOSIS — Z6837 Body mass index (BMI) 37.0-37.9, adult: Secondary | ICD-10-CM | POA: Diagnosis not present

## 2019-03-14 DIAGNOSIS — Z90710 Acquired absence of both cervix and uterus: Secondary | ICD-10-CM | POA: Diagnosis not present

## 2019-03-14 DIAGNOSIS — Z79890 Hormone replacement therapy (postmenopausal): Secondary | ICD-10-CM | POA: Diagnosis not present

## 2019-03-14 DIAGNOSIS — Z981 Arthrodesis status: Secondary | ICD-10-CM | POA: Diagnosis not present

## 2019-03-14 DIAGNOSIS — Z79899 Other long term (current) drug therapy: Secondary | ICD-10-CM | POA: Diagnosis not present

## 2019-03-14 DIAGNOSIS — I6931 Attention and concentration deficit following cerebral infarction: Secondary | ICD-10-CM | POA: Diagnosis not present

## 2019-03-14 DIAGNOSIS — G43909 Migraine, unspecified, not intractable, without status migrainosus: Secondary | ICD-10-CM | POA: Diagnosis present

## 2019-03-14 DIAGNOSIS — I69311 Memory deficit following cerebral infarction: Secondary | ICD-10-CM | POA: Diagnosis not present

## 2019-03-14 DIAGNOSIS — Z96653 Presence of artificial knee joint, bilateral: Secondary | ICD-10-CM | POA: Diagnosis present

## 2019-03-14 DIAGNOSIS — Z789 Other specified health status: Secondary | ICD-10-CM | POA: Diagnosis not present

## 2019-03-14 DIAGNOSIS — R269 Unspecified abnormalities of gait and mobility: Secondary | ICD-10-CM | POA: Diagnosis present

## 2019-04-16 DIAGNOSIS — I63412 Cerebral infarction due to embolism of left middle cerebral artery: Secondary | ICD-10-CM | POA: Diagnosis not present

## 2019-04-17 DIAGNOSIS — E039 Hypothyroidism, unspecified: Secondary | ICD-10-CM | POA: Diagnosis not present

## 2019-04-17 DIAGNOSIS — R5383 Other fatigue: Secondary | ICD-10-CM | POA: Diagnosis not present

## 2019-04-17 DIAGNOSIS — G43909 Migraine, unspecified, not intractable, without status migrainosus: Secondary | ICD-10-CM | POA: Diagnosis not present

## 2019-04-17 DIAGNOSIS — Z5189 Encounter for other specified aftercare: Secondary | ICD-10-CM | POA: Diagnosis not present

## 2019-04-17 DIAGNOSIS — I69320 Aphasia following cerebral infarction: Secondary | ICD-10-CM | POA: Diagnosis not present

## 2019-04-17 DIAGNOSIS — I10 Essential (primary) hypertension: Secondary | ICD-10-CM | POA: Diagnosis not present

## 2019-04-17 DIAGNOSIS — F419 Anxiety disorder, unspecified: Secondary | ICD-10-CM | POA: Diagnosis not present

## 2019-04-17 DIAGNOSIS — G4733 Obstructive sleep apnea (adult) (pediatric): Secondary | ICD-10-CM | POA: Diagnosis not present

## 2019-04-17 DIAGNOSIS — I4891 Unspecified atrial fibrillation: Secondary | ICD-10-CM | POA: Diagnosis not present

## 2019-04-17 DIAGNOSIS — I69318 Other symptoms and signs involving cognitive functions following cerebral infarction: Secondary | ICD-10-CM | POA: Diagnosis not present

## 2019-04-20 DIAGNOSIS — I2510 Atherosclerotic heart disease of native coronary artery without angina pectoris: Secondary | ICD-10-CM | POA: Diagnosis not present

## 2019-04-20 DIAGNOSIS — Z9289 Personal history of other medical treatment: Secondary | ICD-10-CM | POA: Diagnosis not present

## 2019-04-20 DIAGNOSIS — I6789 Other cerebrovascular disease: Secondary | ICD-10-CM | POA: Diagnosis not present

## 2019-04-20 DIAGNOSIS — I499 Cardiac arrhythmia, unspecified: Secondary | ICD-10-CM | POA: Diagnosis not present

## 2019-04-20 DIAGNOSIS — Q245 Malformation of coronary vessels: Secondary | ICD-10-CM | POA: Diagnosis not present

## 2019-04-20 DIAGNOSIS — I4891 Unspecified atrial fibrillation: Secondary | ICD-10-CM | POA: Diagnosis not present

## 2019-04-23 DIAGNOSIS — I4891 Unspecified atrial fibrillation: Secondary | ICD-10-CM | POA: Diagnosis not present

## 2019-04-23 DIAGNOSIS — Z5189 Encounter for other specified aftercare: Secondary | ICD-10-CM | POA: Diagnosis not present

## 2019-04-23 DIAGNOSIS — D510 Vitamin B12 deficiency anemia due to intrinsic factor deficiency: Secondary | ICD-10-CM | POA: Diagnosis not present

## 2019-04-23 DIAGNOSIS — I69320 Aphasia following cerebral infarction: Secondary | ICD-10-CM | POA: Diagnosis not present

## 2019-04-23 DIAGNOSIS — I10 Essential (primary) hypertension: Secondary | ICD-10-CM | POA: Diagnosis not present

## 2019-04-23 DIAGNOSIS — G43909 Migraine, unspecified, not intractable, without status migrainosus: Secondary | ICD-10-CM | POA: Diagnosis not present

## 2019-04-23 DIAGNOSIS — I69318 Other symptoms and signs involving cognitive functions following cerebral infarction: Secondary | ICD-10-CM | POA: Diagnosis not present

## 2019-04-24 DIAGNOSIS — G4700 Insomnia, unspecified: Secondary | ICD-10-CM | POA: Diagnosis not present

## 2019-04-24 DIAGNOSIS — G2581 Restless legs syndrome: Secondary | ICD-10-CM | POA: Diagnosis not present

## 2019-04-24 DIAGNOSIS — E785 Hyperlipidemia, unspecified: Secondary | ICD-10-CM | POA: Diagnosis not present

## 2019-04-24 DIAGNOSIS — I4891 Unspecified atrial fibrillation: Secondary | ICD-10-CM | POA: Diagnosis not present

## 2019-04-24 DIAGNOSIS — E039 Hypothyroidism, unspecified: Secondary | ICD-10-CM | POA: Diagnosis not present

## 2019-04-25 DIAGNOSIS — I69320 Aphasia following cerebral infarction: Secondary | ICD-10-CM | POA: Diagnosis not present

## 2019-04-25 DIAGNOSIS — I4891 Unspecified atrial fibrillation: Secondary | ICD-10-CM | POA: Diagnosis not present

## 2019-04-25 DIAGNOSIS — Z5189 Encounter for other specified aftercare: Secondary | ICD-10-CM | POA: Diagnosis not present

## 2019-04-25 DIAGNOSIS — G43909 Migraine, unspecified, not intractable, without status migrainosus: Secondary | ICD-10-CM | POA: Diagnosis not present

## 2019-04-25 DIAGNOSIS — I69318 Other symptoms and signs involving cognitive functions following cerebral infarction: Secondary | ICD-10-CM | POA: Diagnosis not present

## 2019-04-25 DIAGNOSIS — I10 Essential (primary) hypertension: Secondary | ICD-10-CM | POA: Diagnosis not present

## 2019-05-01 DIAGNOSIS — M4802 Spinal stenosis, cervical region: Secondary | ICD-10-CM | POA: Diagnosis not present

## 2019-05-01 DIAGNOSIS — I639 Cerebral infarction, unspecified: Secondary | ICD-10-CM | POA: Diagnosis not present

## 2019-05-01 DIAGNOSIS — M7918 Myalgia, other site: Secondary | ICD-10-CM | POA: Diagnosis not present

## 2019-05-01 DIAGNOSIS — M25511 Pain in right shoulder: Secondary | ICD-10-CM | POA: Diagnosis not present

## 2019-05-01 DIAGNOSIS — M961 Postlaminectomy syndrome, not elsewhere classified: Secondary | ICD-10-CM | POA: Diagnosis not present

## 2019-05-01 DIAGNOSIS — M75122 Complete rotator cuff tear or rupture of left shoulder, not specified as traumatic: Secondary | ICD-10-CM | POA: Diagnosis not present

## 2019-05-01 DIAGNOSIS — Z96659 Presence of unspecified artificial knee joint: Secondary | ICD-10-CM | POA: Diagnosis not present

## 2019-05-01 DIAGNOSIS — M533 Sacrococcygeal disorders, not elsewhere classified: Secondary | ICD-10-CM | POA: Diagnosis not present

## 2019-05-01 DIAGNOSIS — Z79891 Long term (current) use of opiate analgesic: Secondary | ICD-10-CM | POA: Diagnosis not present

## 2019-05-02 DIAGNOSIS — I69320 Aphasia following cerebral infarction: Secondary | ICD-10-CM | POA: Diagnosis not present

## 2019-05-02 DIAGNOSIS — I69318 Other symptoms and signs involving cognitive functions following cerebral infarction: Secondary | ICD-10-CM | POA: Diagnosis not present

## 2019-05-02 DIAGNOSIS — D510 Vitamin B12 deficiency anemia due to intrinsic factor deficiency: Secondary | ICD-10-CM | POA: Diagnosis not present

## 2019-05-02 DIAGNOSIS — I4891 Unspecified atrial fibrillation: Secondary | ICD-10-CM | POA: Diagnosis not present

## 2019-05-02 DIAGNOSIS — Z5189 Encounter for other specified aftercare: Secondary | ICD-10-CM | POA: Diagnosis not present

## 2019-05-02 DIAGNOSIS — I10 Essential (primary) hypertension: Secondary | ICD-10-CM | POA: Diagnosis not present

## 2019-05-02 DIAGNOSIS — G43909 Migraine, unspecified, not intractable, without status migrainosus: Secondary | ICD-10-CM | POA: Diagnosis not present

## 2019-05-10 DIAGNOSIS — E611 Iron deficiency: Secondary | ICD-10-CM | POA: Diagnosis not present

## 2019-05-10 DIAGNOSIS — E039 Hypothyroidism, unspecified: Secondary | ICD-10-CM | POA: Diagnosis not present

## 2019-05-10 DIAGNOSIS — E559 Vitamin D deficiency, unspecified: Secondary | ICD-10-CM | POA: Diagnosis not present

## 2019-05-10 DIAGNOSIS — G4700 Insomnia, unspecified: Secondary | ICD-10-CM | POA: Diagnosis not present

## 2019-05-10 DIAGNOSIS — Z0001 Encounter for general adult medical examination with abnormal findings: Secondary | ICD-10-CM | POA: Diagnosis not present

## 2019-05-10 DIAGNOSIS — I4819 Other persistent atrial fibrillation: Secondary | ICD-10-CM | POA: Diagnosis not present

## 2019-05-17 DIAGNOSIS — G2581 Restless legs syndrome: Secondary | ICD-10-CM | POA: Diagnosis not present

## 2019-05-17 DIAGNOSIS — Z6837 Body mass index (BMI) 37.0-37.9, adult: Secondary | ICD-10-CM | POA: Diagnosis not present

## 2019-05-17 DIAGNOSIS — E039 Hypothyroidism, unspecified: Secondary | ICD-10-CM | POA: Diagnosis not present

## 2019-05-17 DIAGNOSIS — I10 Essential (primary) hypertension: Secondary | ICD-10-CM | POA: Diagnosis not present

## 2019-05-17 DIAGNOSIS — G4700 Insomnia, unspecified: Secondary | ICD-10-CM | POA: Diagnosis not present

## 2019-05-17 DIAGNOSIS — I482 Chronic atrial fibrillation, unspecified: Secondary | ICD-10-CM | POA: Diagnosis not present

## 2019-05-22 DIAGNOSIS — I10 Essential (primary) hypertension: Secondary | ICD-10-CM | POA: Diagnosis not present

## 2019-05-22 DIAGNOSIS — E039 Hypothyroidism, unspecified: Secondary | ICD-10-CM | POA: Diagnosis not present

## 2019-05-22 DIAGNOSIS — I4891 Unspecified atrial fibrillation: Secondary | ICD-10-CM | POA: Diagnosis not present

## 2019-05-22 DIAGNOSIS — Z5189 Encounter for other specified aftercare: Secondary | ICD-10-CM | POA: Diagnosis not present

## 2019-05-22 DIAGNOSIS — F419 Anxiety disorder, unspecified: Secondary | ICD-10-CM | POA: Diagnosis not present

## 2019-05-22 DIAGNOSIS — G4733 Obstructive sleep apnea (adult) (pediatric): Secondary | ICD-10-CM | POA: Diagnosis not present

## 2019-05-22 DIAGNOSIS — G43909 Migraine, unspecified, not intractable, without status migrainosus: Secondary | ICD-10-CM | POA: Diagnosis not present

## 2019-05-22 DIAGNOSIS — I69318 Other symptoms and signs involving cognitive functions following cerebral infarction: Secondary | ICD-10-CM | POA: Diagnosis not present

## 2019-05-22 DIAGNOSIS — I69320 Aphasia following cerebral infarction: Secondary | ICD-10-CM | POA: Diagnosis not present

## 2019-06-04 DIAGNOSIS — I639 Cerebral infarction, unspecified: Secondary | ICD-10-CM | POA: Diagnosis not present

## 2019-06-06 DIAGNOSIS — I4811 Longstanding persistent atrial fibrillation: Secondary | ICD-10-CM | POA: Diagnosis not present

## 2019-06-06 DIAGNOSIS — I639 Cerebral infarction, unspecified: Secondary | ICD-10-CM | POA: Diagnosis not present

## 2019-06-12 DIAGNOSIS — E039 Hypothyroidism, unspecified: Secondary | ICD-10-CM | POA: Diagnosis not present

## 2019-06-12 DIAGNOSIS — I10 Essential (primary) hypertension: Secondary | ICD-10-CM | POA: Diagnosis not present

## 2019-06-12 DIAGNOSIS — Z5189 Encounter for other specified aftercare: Secondary | ICD-10-CM | POA: Diagnosis not present

## 2019-06-12 DIAGNOSIS — I4891 Unspecified atrial fibrillation: Secondary | ICD-10-CM | POA: Diagnosis not present

## 2019-06-12 DIAGNOSIS — I69320 Aphasia following cerebral infarction: Secondary | ICD-10-CM | POA: Diagnosis not present

## 2019-06-12 DIAGNOSIS — G43909 Migraine, unspecified, not intractable, without status migrainosus: Secondary | ICD-10-CM | POA: Diagnosis not present

## 2019-06-12 DIAGNOSIS — I69318 Other symptoms and signs involving cognitive functions following cerebral infarction: Secondary | ICD-10-CM | POA: Diagnosis not present

## 2019-06-12 DIAGNOSIS — F419 Anxiety disorder, unspecified: Secondary | ICD-10-CM | POA: Diagnosis not present

## 2019-06-12 DIAGNOSIS — G4733 Obstructive sleep apnea (adult) (pediatric): Secondary | ICD-10-CM | POA: Diagnosis not present

## 2019-06-19 DIAGNOSIS — M961 Postlaminectomy syndrome, not elsewhere classified: Secondary | ICD-10-CM | POA: Diagnosis not present

## 2019-06-19 DIAGNOSIS — Z79891 Long term (current) use of opiate analgesic: Secondary | ICD-10-CM | POA: Diagnosis not present

## 2019-06-19 DIAGNOSIS — Z96659 Presence of unspecified artificial knee joint: Secondary | ICD-10-CM | POA: Diagnosis not present

## 2019-06-19 DIAGNOSIS — M7918 Myalgia, other site: Secondary | ICD-10-CM | POA: Diagnosis not present

## 2019-06-19 DIAGNOSIS — M75122 Complete rotator cuff tear or rupture of left shoulder, not specified as traumatic: Secondary | ICD-10-CM | POA: Diagnosis not present

## 2019-06-19 DIAGNOSIS — M533 Sacrococcygeal disorders, not elsewhere classified: Secondary | ICD-10-CM | POA: Diagnosis not present

## 2019-06-19 DIAGNOSIS — M25511 Pain in right shoulder: Secondary | ICD-10-CM | POA: Diagnosis not present

## 2019-06-19 DIAGNOSIS — I639 Cerebral infarction, unspecified: Secondary | ICD-10-CM | POA: Diagnosis not present

## 2019-06-19 DIAGNOSIS — M4802 Spinal stenosis, cervical region: Secondary | ICD-10-CM | POA: Diagnosis not present

## 2019-07-07 ENCOUNTER — Other Ambulatory Visit: Payer: Self-pay | Admitting: Family Medicine

## 2019-07-09 NOTE — Telephone Encounter (Signed)
Due for CPX

## 2019-07-18 DIAGNOSIS — M75122 Complete rotator cuff tear or rupture of left shoulder, not specified as traumatic: Secondary | ICD-10-CM | POA: Diagnosis not present

## 2019-07-18 DIAGNOSIS — Z79891 Long term (current) use of opiate analgesic: Secondary | ICD-10-CM | POA: Diagnosis not present

## 2019-07-18 DIAGNOSIS — M7918 Myalgia, other site: Secondary | ICD-10-CM | POA: Diagnosis not present

## 2019-07-18 DIAGNOSIS — I639 Cerebral infarction, unspecified: Secondary | ICD-10-CM | POA: Diagnosis not present

## 2019-07-18 DIAGNOSIS — M961 Postlaminectomy syndrome, not elsewhere classified: Secondary | ICD-10-CM | POA: Diagnosis not present

## 2019-07-18 DIAGNOSIS — Z96659 Presence of unspecified artificial knee joint: Secondary | ICD-10-CM | POA: Diagnosis not present

## 2019-07-18 DIAGNOSIS — M25511 Pain in right shoulder: Secondary | ICD-10-CM | POA: Diagnosis not present

## 2019-07-18 DIAGNOSIS — M4802 Spinal stenosis, cervical region: Secondary | ICD-10-CM | POA: Diagnosis not present

## 2019-07-18 DIAGNOSIS — M533 Sacrococcygeal disorders, not elsewhere classified: Secondary | ICD-10-CM | POA: Diagnosis not present

## 2019-08-09 DIAGNOSIS — G2581 Restless legs syndrome: Secondary | ICD-10-CM | POA: Diagnosis not present

## 2019-08-09 DIAGNOSIS — E785 Hyperlipidemia, unspecified: Secondary | ICD-10-CM | POA: Diagnosis not present

## 2019-08-09 DIAGNOSIS — I482 Chronic atrial fibrillation, unspecified: Secondary | ICD-10-CM | POA: Diagnosis not present

## 2019-08-09 DIAGNOSIS — G4700 Insomnia, unspecified: Secondary | ICD-10-CM | POA: Diagnosis not present

## 2019-08-09 DIAGNOSIS — R5383 Other fatigue: Secondary | ICD-10-CM | POA: Diagnosis not present

## 2019-08-09 DIAGNOSIS — E039 Hypothyroidism, unspecified: Secondary | ICD-10-CM | POA: Diagnosis not present

## 2019-08-09 DIAGNOSIS — Z6837 Body mass index (BMI) 37.0-37.9, adult: Secondary | ICD-10-CM | POA: Diagnosis not present

## 2019-09-06 DIAGNOSIS — M4802 Spinal stenosis, cervical region: Secondary | ICD-10-CM | POA: Diagnosis not present

## 2019-09-06 DIAGNOSIS — Z96659 Presence of unspecified artificial knee joint: Secondary | ICD-10-CM | POA: Diagnosis not present

## 2019-09-06 DIAGNOSIS — M961 Postlaminectomy syndrome, not elsewhere classified: Secondary | ICD-10-CM | POA: Diagnosis not present

## 2019-09-06 DIAGNOSIS — M533 Sacrococcygeal disorders, not elsewhere classified: Secondary | ICD-10-CM | POA: Diagnosis not present

## 2019-09-06 DIAGNOSIS — I639 Cerebral infarction, unspecified: Secondary | ICD-10-CM | POA: Diagnosis not present

## 2019-09-23 ENCOUNTER — Other Ambulatory Visit: Payer: Self-pay | Admitting: Family Medicine

## 2019-09-23 DIAGNOSIS — D509 Iron deficiency anemia, unspecified: Secondary | ICD-10-CM

## 2019-10-31 DIAGNOSIS — I4891 Unspecified atrial fibrillation: Secondary | ICD-10-CM | POA: Diagnosis not present

## 2019-10-31 DIAGNOSIS — Z7901 Long term (current) use of anticoagulants: Secondary | ICD-10-CM | POA: Diagnosis not present

## 2019-10-31 DIAGNOSIS — I4811 Longstanding persistent atrial fibrillation: Secondary | ICD-10-CM | POA: Diagnosis not present

## 2019-10-31 DIAGNOSIS — G8929 Other chronic pain: Secondary | ICD-10-CM | POA: Diagnosis not present

## 2019-10-31 DIAGNOSIS — I639 Cerebral infarction, unspecified: Secondary | ICD-10-CM | POA: Diagnosis not present

## 2019-10-31 DIAGNOSIS — I2510 Atherosclerotic heart disease of native coronary artery without angina pectoris: Secondary | ICD-10-CM | POA: Diagnosis not present

## 2019-11-05 DIAGNOSIS — I4811 Longstanding persistent atrial fibrillation: Secondary | ICD-10-CM | POA: Diagnosis not present

## 2019-11-19 DIAGNOSIS — I639 Cerebral infarction, unspecified: Secondary | ICD-10-CM | POA: Diagnosis not present

## 2019-11-19 DIAGNOSIS — M961 Postlaminectomy syndrome, not elsewhere classified: Secondary | ICD-10-CM | POA: Diagnosis not present

## 2019-11-19 DIAGNOSIS — Z96659 Presence of unspecified artificial knee joint: Secondary | ICD-10-CM | POA: Diagnosis not present

## 2019-11-19 DIAGNOSIS — M533 Sacrococcygeal disorders, not elsewhere classified: Secondary | ICD-10-CM | POA: Diagnosis not present

## 2019-11-19 DIAGNOSIS — Z79891 Long term (current) use of opiate analgesic: Secondary | ICD-10-CM | POA: Diagnosis not present

## 2019-11-19 DIAGNOSIS — M7918 Myalgia, other site: Secondary | ICD-10-CM | POA: Diagnosis not present

## 2019-11-19 DIAGNOSIS — M75122 Complete rotator cuff tear or rupture of left shoulder, not specified as traumatic: Secondary | ICD-10-CM | POA: Diagnosis not present

## 2019-11-19 DIAGNOSIS — M25511 Pain in right shoulder: Secondary | ICD-10-CM | POA: Diagnosis not present

## 2019-11-19 DIAGNOSIS — M4802 Spinal stenosis, cervical region: Secondary | ICD-10-CM | POA: Diagnosis not present

## 2019-12-04 DIAGNOSIS — I482 Chronic atrial fibrillation, unspecified: Secondary | ICD-10-CM | POA: Diagnosis not present

## 2019-12-04 DIAGNOSIS — E039 Hypothyroidism, unspecified: Secondary | ICD-10-CM | POA: Diagnosis not present

## 2019-12-04 DIAGNOSIS — R5383 Other fatigue: Secondary | ICD-10-CM | POA: Diagnosis not present

## 2019-12-04 DIAGNOSIS — G4700 Insomnia, unspecified: Secondary | ICD-10-CM | POA: Diagnosis not present

## 2019-12-04 DIAGNOSIS — I10 Essential (primary) hypertension: Secondary | ICD-10-CM | POA: Diagnosis not present

## 2019-12-04 DIAGNOSIS — G2581 Restless legs syndrome: Secondary | ICD-10-CM | POA: Diagnosis not present

## 2019-12-04 DIAGNOSIS — E7800 Pure hypercholesterolemia, unspecified: Secondary | ICD-10-CM | POA: Diagnosis not present

## 2019-12-06 DIAGNOSIS — M461 Sacroiliitis, not elsewhere classified: Secondary | ICD-10-CM | POA: Diagnosis not present

## 2019-12-06 DIAGNOSIS — M47816 Spondylosis without myelopathy or radiculopathy, lumbar region: Secondary | ICD-10-CM | POA: Diagnosis not present

## 2019-12-06 DIAGNOSIS — M533 Sacrococcygeal disorders, not elsewhere classified: Secondary | ICD-10-CM | POA: Diagnosis not present

## 2019-12-06 DIAGNOSIS — Z8673 Personal history of transient ischemic attack (TIA), and cerebral infarction without residual deficits: Secondary | ICD-10-CM | POA: Diagnosis not present

## 2019-12-06 DIAGNOSIS — G8929 Other chronic pain: Secondary | ICD-10-CM | POA: Diagnosis not present

## 2019-12-06 DIAGNOSIS — D510 Vitamin B12 deficiency anemia due to intrinsic factor deficiency: Secondary | ICD-10-CM | POA: Diagnosis not present

## 2019-12-06 DIAGNOSIS — Z7901 Long term (current) use of anticoagulants: Secondary | ICD-10-CM | POA: Diagnosis not present

## 2019-12-06 DIAGNOSIS — M4802 Spinal stenosis, cervical region: Secondary | ICD-10-CM | POA: Diagnosis not present

## 2019-12-06 DIAGNOSIS — M75122 Complete rotator cuff tear or rupture of left shoulder, not specified as traumatic: Secondary | ICD-10-CM | POA: Diagnosis not present

## 2019-12-06 DIAGNOSIS — I890 Lymphedema, not elsewhere classified: Secondary | ICD-10-CM | POA: Diagnosis not present

## 2019-12-06 DIAGNOSIS — M961 Postlaminectomy syndrome, not elsewhere classified: Secondary | ICD-10-CM | POA: Diagnosis not present

## 2019-12-06 DIAGNOSIS — Z96652 Presence of left artificial knee joint: Secondary | ICD-10-CM | POA: Diagnosis not present

## 2019-12-06 DIAGNOSIS — M75101 Unspecified rotator cuff tear or rupture of right shoulder, not specified as traumatic: Secondary | ICD-10-CM | POA: Diagnosis not present

## 2019-12-06 DIAGNOSIS — K219 Gastro-esophageal reflux disease without esophagitis: Secondary | ICD-10-CM | POA: Diagnosis not present

## 2019-12-06 DIAGNOSIS — I2510 Atherosclerotic heart disease of native coronary artery without angina pectoris: Secondary | ICD-10-CM | POA: Diagnosis not present

## 2019-12-06 DIAGNOSIS — Z79891 Long term (current) use of opiate analgesic: Secondary | ICD-10-CM | POA: Diagnosis not present

## 2019-12-06 DIAGNOSIS — I4891 Unspecified atrial fibrillation: Secondary | ICD-10-CM | POA: Diagnosis not present

## 2019-12-19 DIAGNOSIS — Z7901 Long term (current) use of anticoagulants: Secondary | ICD-10-CM | POA: Diagnosis not present

## 2019-12-19 DIAGNOSIS — I4811 Longstanding persistent atrial fibrillation: Secondary | ICD-10-CM | POA: Diagnosis not present

## 2019-12-19 DIAGNOSIS — I2510 Atherosclerotic heart disease of native coronary artery without angina pectoris: Secondary | ICD-10-CM | POA: Diagnosis not present

## 2019-12-20 DIAGNOSIS — Z23 Encounter for immunization: Secondary | ICD-10-CM | POA: Diagnosis not present

## 2020-01-01 DIAGNOSIS — I6523 Occlusion and stenosis of bilateral carotid arteries: Secondary | ICD-10-CM | POA: Diagnosis not present

## 2020-02-07 DIAGNOSIS — R5383 Other fatigue: Secondary | ICD-10-CM | POA: Diagnosis not present

## 2020-02-07 DIAGNOSIS — Z23 Encounter for immunization: Secondary | ICD-10-CM | POA: Diagnosis not present

## 2020-02-22 DIAGNOSIS — Z79891 Long term (current) use of opiate analgesic: Secondary | ICD-10-CM | POA: Diagnosis not present

## 2020-02-22 DIAGNOSIS — Z96659 Presence of unspecified artificial knee joint: Secondary | ICD-10-CM | POA: Diagnosis not present

## 2020-02-22 DIAGNOSIS — M7918 Myalgia, other site: Secondary | ICD-10-CM | POA: Diagnosis not present

## 2020-02-22 DIAGNOSIS — M533 Sacrococcygeal disorders, not elsewhere classified: Secondary | ICD-10-CM | POA: Diagnosis not present

## 2020-02-22 DIAGNOSIS — M961 Postlaminectomy syndrome, not elsewhere classified: Secondary | ICD-10-CM | POA: Diagnosis not present

## 2020-02-22 DIAGNOSIS — M75122 Complete rotator cuff tear or rupture of left shoulder, not specified as traumatic: Secondary | ICD-10-CM | POA: Diagnosis not present

## 2020-02-28 DIAGNOSIS — I10 Essential (primary) hypertension: Secondary | ICD-10-CM | POA: Diagnosis not present

## 2020-02-28 DIAGNOSIS — E039 Hypothyroidism, unspecified: Secondary | ICD-10-CM | POA: Diagnosis not present

## 2020-02-28 DIAGNOSIS — I482 Chronic atrial fibrillation, unspecified: Secondary | ICD-10-CM | POA: Diagnosis not present

## 2020-02-28 DIAGNOSIS — G4700 Insomnia, unspecified: Secondary | ICD-10-CM | POA: Diagnosis not present

## 2020-02-28 DIAGNOSIS — I6523 Occlusion and stenosis of bilateral carotid arteries: Secondary | ICD-10-CM | POA: Diagnosis not present

## 2020-02-28 DIAGNOSIS — E782 Mixed hyperlipidemia: Secondary | ICD-10-CM | POA: Diagnosis not present

## 2020-03-19 DIAGNOSIS — M75122 Complete rotator cuff tear or rupture of left shoulder, not specified as traumatic: Secondary | ICD-10-CM | POA: Diagnosis not present

## 2020-03-19 DIAGNOSIS — M5125 Other intervertebral disc displacement, thoracolumbar region: Secondary | ICD-10-CM | POA: Diagnosis not present

## 2020-03-19 DIAGNOSIS — K219 Gastro-esophageal reflux disease without esophagitis: Secondary | ICD-10-CM | POA: Diagnosis not present

## 2020-03-19 DIAGNOSIS — M4326 Fusion of spine, lumbar region: Secondary | ICD-10-CM | POA: Diagnosis not present

## 2020-03-19 DIAGNOSIS — I890 Lymphedema, not elsewhere classified: Secondary | ICD-10-CM | POA: Diagnosis not present

## 2020-03-19 DIAGNOSIS — M4327 Fusion of spine, lumbosacral region: Secondary | ICD-10-CM | POA: Diagnosis not present

## 2020-03-19 DIAGNOSIS — I4891 Unspecified atrial fibrillation: Secondary | ICD-10-CM | POA: Diagnosis not present

## 2020-03-19 DIAGNOSIS — M4805 Spinal stenosis, thoracolumbar region: Secondary | ICD-10-CM | POA: Diagnosis not present

## 2020-03-19 DIAGNOSIS — Z96652 Presence of left artificial knee joint: Secondary | ICD-10-CM | POA: Diagnosis not present

## 2020-03-19 DIAGNOSIS — Z7901 Long term (current) use of anticoagulants: Secondary | ICD-10-CM | POA: Diagnosis not present

## 2020-03-19 DIAGNOSIS — M48061 Spinal stenosis, lumbar region without neurogenic claudication: Secondary | ICD-10-CM | POA: Diagnosis not present

## 2020-03-19 DIAGNOSIS — Z8673 Personal history of transient ischemic attack (TIA), and cerebral infarction without residual deficits: Secondary | ICD-10-CM | POA: Diagnosis not present

## 2020-03-19 DIAGNOSIS — M47816 Spondylosis without myelopathy or radiculopathy, lumbar region: Secondary | ICD-10-CM | POA: Diagnosis not present

## 2020-03-19 DIAGNOSIS — M4316 Spondylolisthesis, lumbar region: Secondary | ICD-10-CM | POA: Diagnosis not present

## 2020-03-19 DIAGNOSIS — I2510 Atherosclerotic heart disease of native coronary artery without angina pectoris: Secondary | ICD-10-CM | POA: Diagnosis not present

## 2020-03-19 DIAGNOSIS — M461 Sacroiliitis, not elsewhere classified: Secondary | ICD-10-CM | POA: Diagnosis not present

## 2020-03-19 DIAGNOSIS — M5126 Other intervertebral disc displacement, lumbar region: Secondary | ICD-10-CM | POA: Diagnosis not present

## 2020-03-19 DIAGNOSIS — Z79899 Other long term (current) drug therapy: Secondary | ICD-10-CM | POA: Diagnosis not present

## 2020-03-19 DIAGNOSIS — M50221 Other cervical disc displacement at C4-C5 level: Secondary | ICD-10-CM | POA: Diagnosis not present

## 2020-03-19 DIAGNOSIS — M4802 Spinal stenosis, cervical region: Secondary | ICD-10-CM | POA: Diagnosis not present

## 2020-03-19 DIAGNOSIS — M533 Sacrococcygeal disorders, not elsewhere classified: Secondary | ICD-10-CM | POA: Diagnosis not present

## 2020-03-19 DIAGNOSIS — M961 Postlaminectomy syndrome, not elsewhere classified: Secondary | ICD-10-CM | POA: Diagnosis not present

## 2020-03-19 DIAGNOSIS — Z79891 Long term (current) use of opiate analgesic: Secondary | ICD-10-CM | POA: Diagnosis not present

## 2020-03-19 DIAGNOSIS — D510 Vitamin B12 deficiency anemia due to intrinsic factor deficiency: Secondary | ICD-10-CM | POA: Diagnosis not present

## 2020-03-19 DIAGNOSIS — G8929 Other chronic pain: Secondary | ICD-10-CM | POA: Diagnosis not present

## 2020-03-19 DIAGNOSIS — M2578 Osteophyte, vertebrae: Secondary | ICD-10-CM | POA: Diagnosis not present

## 2020-04-05 DIAGNOSIS — R9389 Abnormal findings on diagnostic imaging of other specified body structures: Secondary | ICD-10-CM | POA: Diagnosis not present

## 2020-04-05 DIAGNOSIS — Z6837 Body mass index (BMI) 37.0-37.9, adult: Secondary | ICD-10-CM | POA: Diagnosis not present

## 2020-04-05 DIAGNOSIS — Z7901 Long term (current) use of anticoagulants: Secondary | ICD-10-CM | POA: Diagnosis not present

## 2020-04-05 DIAGNOSIS — R059 Cough, unspecified: Secondary | ICD-10-CM | POA: Diagnosis not present

## 2020-04-05 DIAGNOSIS — U071 COVID-19: Secondary | ICD-10-CM | POA: Diagnosis not present

## 2020-04-05 DIAGNOSIS — R0602 Shortness of breath: Secondary | ICD-10-CM | POA: Diagnosis not present

## 2020-04-05 DIAGNOSIS — Z8673 Personal history of transient ischemic attack (TIA), and cerebral infarction without residual deficits: Secondary | ICD-10-CM | POA: Diagnosis not present

## 2020-04-05 DIAGNOSIS — R509 Fever, unspecified: Secondary | ICD-10-CM | POA: Diagnosis not present

## 2020-04-05 DIAGNOSIS — J811 Chronic pulmonary edema: Secondary | ICD-10-CM | POA: Diagnosis not present

## 2020-04-05 DIAGNOSIS — I4891 Unspecified atrial fibrillation: Secondary | ICD-10-CM | POA: Diagnosis not present

## 2020-04-05 DIAGNOSIS — I509 Heart failure, unspecified: Secondary | ICD-10-CM | POA: Diagnosis not present

## 2020-04-05 DIAGNOSIS — R600 Localized edema: Secondary | ICD-10-CM | POA: Diagnosis not present

## 2020-04-06 DIAGNOSIS — R509 Fever, unspecified: Secondary | ICD-10-CM | POA: Diagnosis not present

## 2020-04-06 DIAGNOSIS — R059 Cough, unspecified: Secondary | ICD-10-CM | POA: Diagnosis not present

## 2020-04-06 DIAGNOSIS — R9389 Abnormal findings on diagnostic imaging of other specified body structures: Secondary | ICD-10-CM | POA: Diagnosis not present

## 2020-04-09 DIAGNOSIS — I10 Essential (primary) hypertension: Secondary | ICD-10-CM | POA: Diagnosis not present

## 2020-04-09 DIAGNOSIS — U071 COVID-19: Secondary | ICD-10-CM | POA: Diagnosis not present

## 2020-04-09 DIAGNOSIS — I272 Pulmonary hypertension, unspecified: Secondary | ICD-10-CM | POA: Diagnosis not present

## 2021-05-28 IMAGING — CT CT OF THE RIGHT SHOULDER WITHOUT CONTRAST
1 series · 12 of 14 positions shown, 15 images · non-contrast
Comparison: X-ray 10/26/2018

CLINICAL DATA: Severe right shoulder pain

EXAM:
CT OF THE UPPER RIGHT EXTREMITY WITHOUT CONTRAST
TECHNIQUE: Multidetector CT imaging of the upper right extremity was performed
according to the standard protocol.

[Series 3: soft tissue · axial · 0.49mm/px · z∈[-208,-54]mm · 12 of 93 slices shown, 15 images]
[im 8/93  soft-tissue]
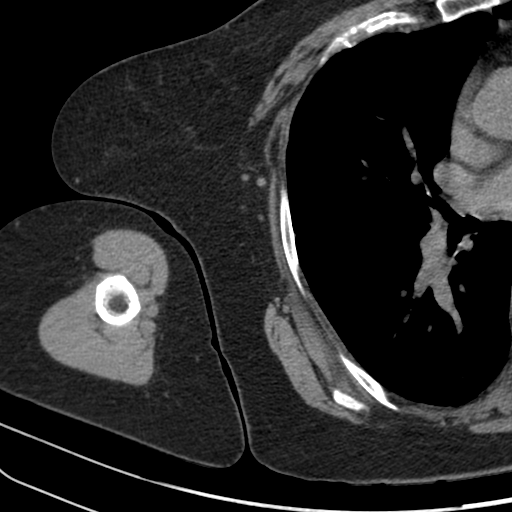
[im 8/93  bone]
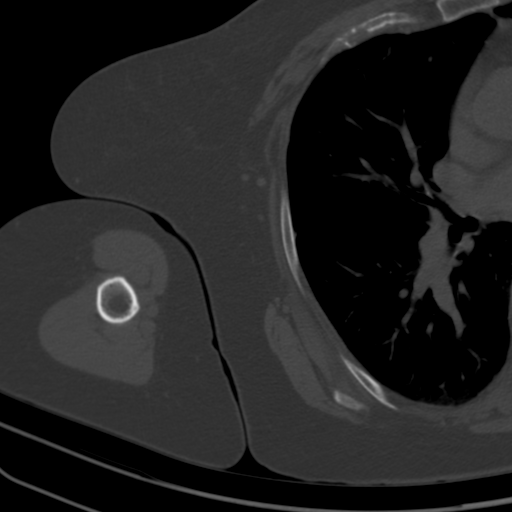
[im 15/93  bone]
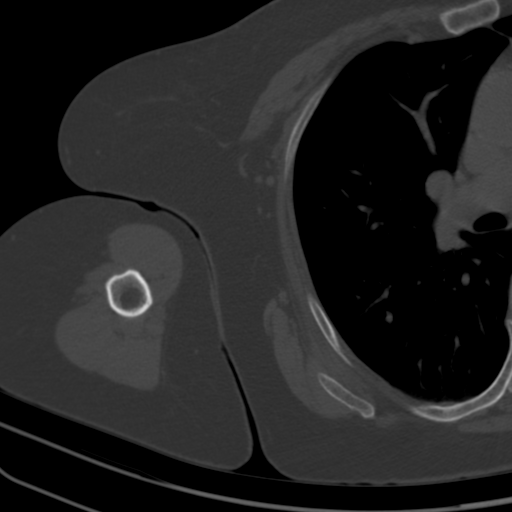
[im 22/93  bone]
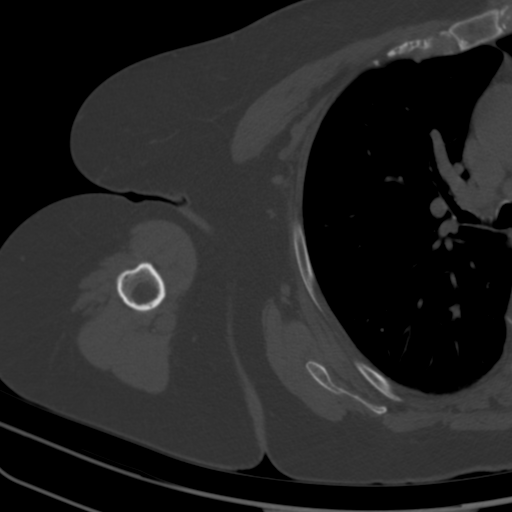
[im 29/93  bone]
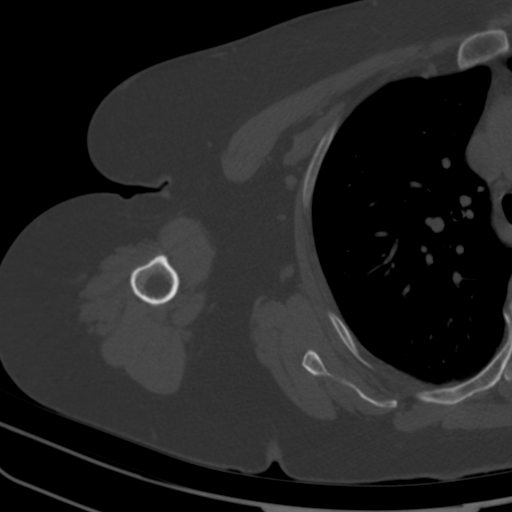
[im 36/93  soft-tissue]
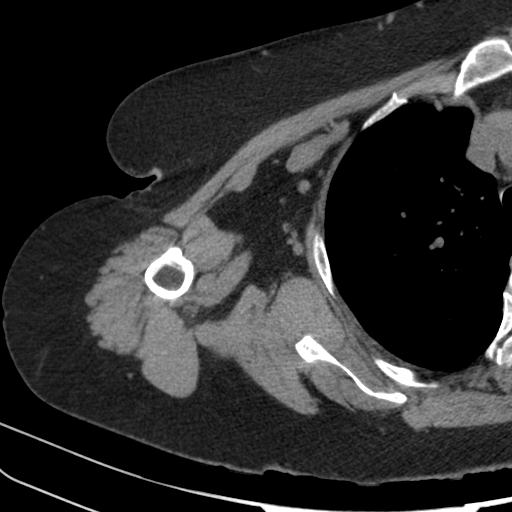
[im 36/93  bone]
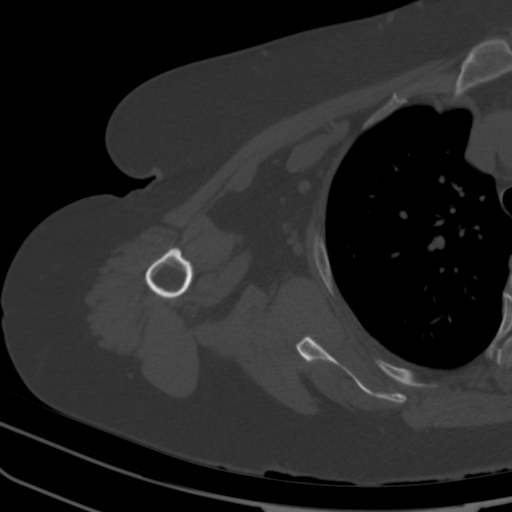
[im 43/93  bone]
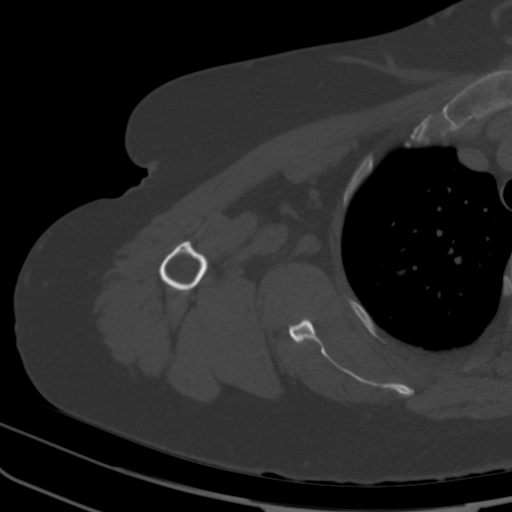
[im 50/93  bone]
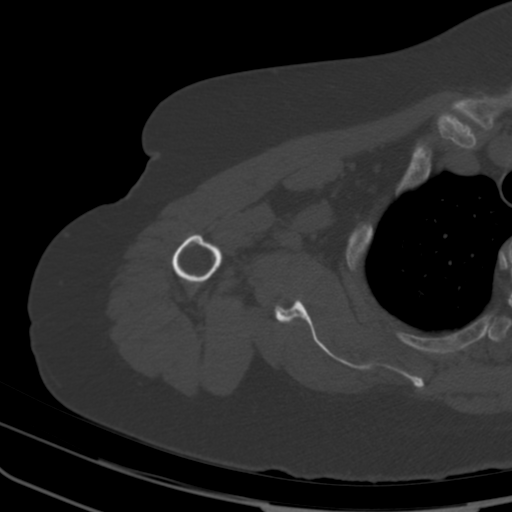
[im 57/93  bone]
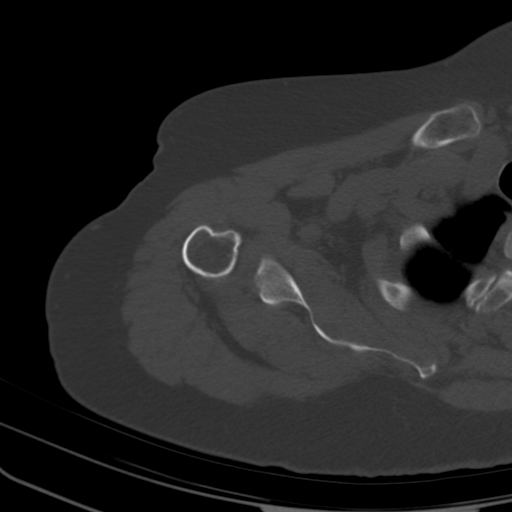
[im 64/93  soft-tissue]
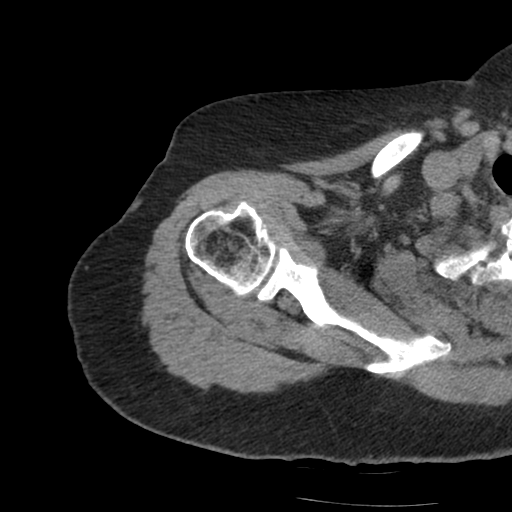
[im 64/93  bone]
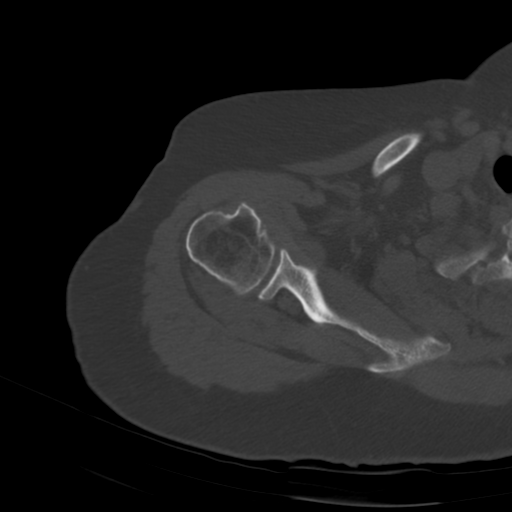
[im 71/93  bone]
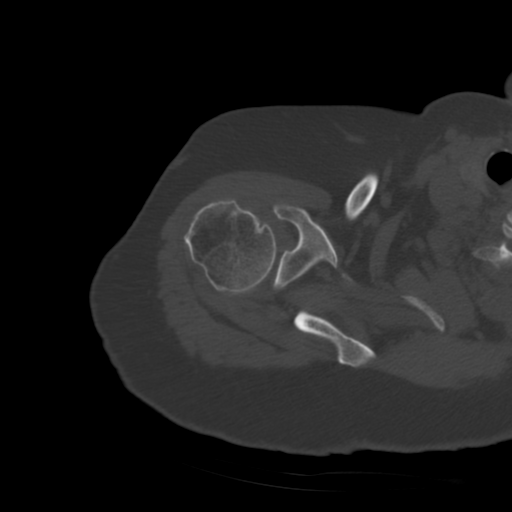
[im 78/93  bone]
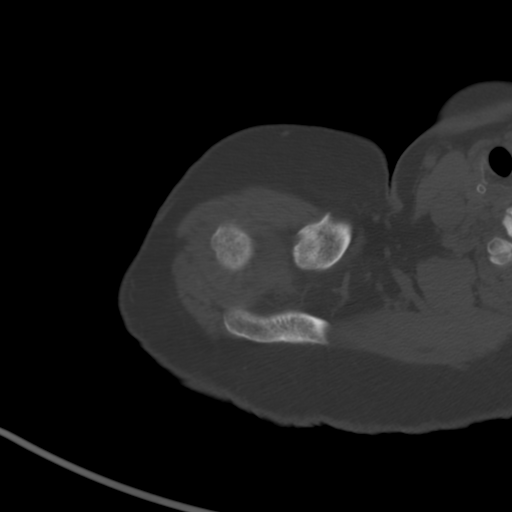
[im 85/93  bone]
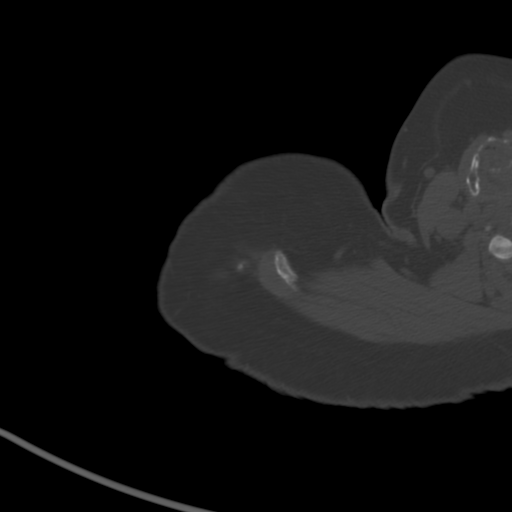

[12 of 14 positions shown; findings below may reference images not displayed]

FINDINGS: Bones/Joint/Cartilage

No acute fracture or dislocation. Minimal glenohumeral joint space
narrowing. Humeral head is slightly superior subluxed relative to
the glenoid. There are small marginal inferior humeral head
osteophytes. There are small tubular lucencies in the humeral head,
likely reflecting sequela of prior rotator cuff repair. Preserved
glenoid bone stock. Mild degenerative changes of the AC joint. Type
2 acromion.

Ligaments

Suboptimally assessed by CT.

Muscles and Tendons

Mild atrophy and fatty infiltration of the supraspinatus muscle
belly. Minimal fatty infiltration of the infraspinatus muscle.
Subscapularis and teres minor muscles appear normal in bulk. Rotator
cuff tendons are not well characterized. Supraspinatus tendon
appears attenuated and is likely at least partially torn.

Soft tissues

No identifiable glenohumeral joint effusion. No significant fluid in
the subacromial-subdeltoid bursa. No right axillary lymphadenopathy.
Visualized portion of the right lung is clear.
IMPRESSION: 1. Mild degenerative changes of the glenohumeral and
acromioclavicular joints.
2. Humeral head is slightly high-riding relative to the glenoid with
findings suggestive of underlying tear of the supraspinatus tendon.
